# Patient Record
Sex: Female | Born: 1965 | Race: White | Hispanic: No | Marital: Married | Smoking: Never smoker
Health system: Southern US, Community
[De-identification: ages and names within clinical notes are randomized; demographics above are authoritative.]

## PROBLEM LIST (undated history)

## (undated) DIAGNOSIS — C50919 Malignant neoplasm of unspecified site of unspecified female breast: Secondary | ICD-10-CM

## (undated) DIAGNOSIS — IMO0002 Reserved for concepts with insufficient information to code with codable children: Secondary | ICD-10-CM

## (undated) DIAGNOSIS — M810 Age-related osteoporosis without current pathological fracture: Secondary | ICD-10-CM

## (undated) DIAGNOSIS — O021 Missed abortion: Secondary | ICD-10-CM

## (undated) DIAGNOSIS — Z1371 Encounter for nonprocreative screening for genetic disease carrier status: Secondary | ICD-10-CM

## (undated) HISTORY — PX: OTHER SURGICAL HISTORY: SHX169

## (undated) HISTORY — DX: Malignant neoplasm of unspecified site of unspecified female breast: C50.919

## (undated) HISTORY — DX: Reserved for concepts with insufficient information to code with codable children: IMO0002

## (undated) HISTORY — DX: Missed abortion: O02.1

## (undated) HISTORY — DX: Encounter for nonprocreative screening for genetic disease carrier status: Z13.71

## (undated) HISTORY — DX: Age-related osteoporosis without current pathological fracture: M81.0

## (undated) HISTORY — PX: DILATION AND CURETTAGE OF UTERUS: SHX78

## (undated) HISTORY — PX: VAGINA SURGERY: SHX829

---

## 1997-09-24 ENCOUNTER — Encounter: Admission: RE | Admit: 1997-09-24 | Discharge: 1997-12-23 | Payer: Self-pay | Admitting: Gynecology

## 1997-11-30 ENCOUNTER — Inpatient Hospital Stay (HOSPITAL_COMMUNITY): Admission: AD | Admit: 1997-11-30 | Discharge: 1997-12-02 | Payer: Self-pay | Admitting: Obstetrics and Gynecology

## 2001-05-18 ENCOUNTER — Other Ambulatory Visit: Admission: RE | Admit: 2001-05-18 | Discharge: 2001-05-18 | Payer: Self-pay | Admitting: Gynecology

## 2001-12-13 ENCOUNTER — Inpatient Hospital Stay (HOSPITAL_COMMUNITY): Admission: AD | Admit: 2001-12-13 | Discharge: 2001-12-15 | Payer: Self-pay | Admitting: Gynecology

## 2001-12-13 ENCOUNTER — Encounter (INDEPENDENT_AMBULATORY_CARE_PROVIDER_SITE_OTHER): Payer: Self-pay

## 2002-01-24 ENCOUNTER — Other Ambulatory Visit: Admission: RE | Admit: 2002-01-24 | Discharge: 2002-01-24 | Payer: Self-pay | Admitting: Gynecology

## 2004-04-14 ENCOUNTER — Other Ambulatory Visit: Admission: RE | Admit: 2004-04-14 | Discharge: 2004-04-14 | Payer: Self-pay | Admitting: Gynecology

## 2005-05-10 ENCOUNTER — Other Ambulatory Visit: Admission: RE | Admit: 2005-05-10 | Discharge: 2005-05-10 | Payer: Self-pay | Admitting: Gynecology

## 2006-05-12 ENCOUNTER — Other Ambulatory Visit: Admission: RE | Admit: 2006-05-12 | Discharge: 2006-05-12 | Payer: Self-pay | Admitting: Gynecology

## 2007-05-21 ENCOUNTER — Other Ambulatory Visit: Admission: RE | Admit: 2007-05-21 | Discharge: 2007-05-21 | Payer: Self-pay | Admitting: Gynecology

## 2008-01-11 DIAGNOSIS — C50919 Malignant neoplasm of unspecified site of unspecified female breast: Secondary | ICD-10-CM

## 2008-01-11 HISTORY — DX: Malignant neoplasm of unspecified site of unspecified female breast: C50.919

## 2008-05-26 ENCOUNTER — Ambulatory Visit: Payer: Self-pay | Admitting: Gynecology

## 2008-05-26 ENCOUNTER — Encounter: Payer: Self-pay | Admitting: Gynecology

## 2008-05-26 ENCOUNTER — Other Ambulatory Visit: Admission: RE | Admit: 2008-05-26 | Discharge: 2008-05-26 | Payer: Self-pay | Admitting: Gynecology

## 2008-07-23 ENCOUNTER — Encounter: Admission: RE | Admit: 2008-07-23 | Discharge: 2008-07-23 | Payer: Self-pay | Admitting: Radiology

## 2008-07-24 ENCOUNTER — Ambulatory Visit: Payer: Self-pay | Admitting: Genetic Counselor

## 2008-09-02 ENCOUNTER — Ambulatory Visit (HOSPITAL_COMMUNITY): Admission: RE | Admit: 2008-09-02 | Discharge: 2008-09-02 | Payer: Self-pay | Admitting: General Surgery

## 2008-09-02 ENCOUNTER — Encounter (INDEPENDENT_AMBULATORY_CARE_PROVIDER_SITE_OTHER): Payer: Self-pay | Admitting: General Surgery

## 2008-09-02 HISTORY — PX: BREAST LUMPECTOMY: SHX2

## 2008-09-24 ENCOUNTER — Ambulatory Visit: Payer: Self-pay | Admitting: Oncology

## 2008-09-29 LAB — CBC WITH DIFFERENTIAL/PLATELET
BASO%: 0.7 % (ref 0.0–2.0)
Basophils Absolute: 0 10*3/uL (ref 0.0–0.1)
EOS%: 2.2 % (ref 0.0–7.0)
HGB: 13.2 g/dL (ref 11.6–15.9)
MCH: 28.4 pg (ref 25.1–34.0)
MCHC: 33.6 g/dL (ref 31.5–36.0)
MONO#: 0.4 10*3/uL (ref 0.1–0.9)
RDW: 14.2 % (ref 11.2–14.5)
WBC: 5.4 10*3/uL (ref 3.9–10.3)
lymph#: 1.2 10*3/uL (ref 0.9–3.3)

## 2008-09-30 ENCOUNTER — Ambulatory Visit: Admission: RE | Admit: 2008-09-30 | Discharge: 2008-10-09 | Payer: Self-pay | Admitting: Radiation Oncology

## 2008-09-30 LAB — COMPREHENSIVE METABOLIC PANEL
ALT: <8 U/L (ref 0–35)
AST: 16 U/L (ref 0–37)
Albumin: 4.5 g/dL (ref 3.5–5.2)
CO2: 23 mEq/L (ref 19–32)
Calcium: 8.9 mg/dL (ref 8.4–10.5)
Chloride: 106 mEq/L (ref 96–112)
Potassium: 3.7 mEq/L (ref 3.5–5.3)
Total Protein: 7.6 g/dL (ref 6.0–8.3)

## 2008-10-03 ENCOUNTER — Ambulatory Visit (HOSPITAL_COMMUNITY): Admission: RE | Admit: 2008-10-03 | Discharge: 2008-10-03 | Payer: Self-pay | Admitting: Oncology

## 2008-10-09 ENCOUNTER — Ambulatory Visit (HOSPITAL_COMMUNITY): Admission: RE | Admit: 2008-10-09 | Discharge: 2008-10-09 | Payer: Self-pay | Admitting: Oncology

## 2008-10-17 ENCOUNTER — Ambulatory Visit: Payer: Self-pay | Admitting: Oncology

## 2008-10-24 LAB — CBC WITH DIFFERENTIAL/PLATELET
BASO%: 0.5 % (ref 0.0–2.0)
EOS%: 0.4 % (ref 0.0–7.0)
HCT: 37.1 % (ref 34.8–46.6)
LYMPH%: 9.5 % — ABNORMAL LOW (ref 14.0–49.7)
MCH: 29 pg (ref 25.1–34.0)
MCHC: 33.6 g/dL (ref 31.5–36.0)
MONO#: 1.1 10*3/uL — ABNORMAL HIGH (ref 0.1–0.9)
MONO%: 8 % (ref 0.0–14.0)
NEUT%: 81.6 % — ABNORMAL HIGH (ref 38.4–76.8)
Platelets: 269 10*3/uL (ref 145–400)
RBC: 4.28 10*6/uL (ref 3.70–5.45)
WBC: 14.1 10*3/uL — ABNORMAL HIGH (ref 3.9–10.3)

## 2008-10-24 LAB — BASIC METABOLIC PANEL
Calcium: 8.9 mg/dL (ref 8.4–10.5)
Creatinine, Ser: 0.96 mg/dL (ref 0.40–1.20)
Sodium: 139 mEq/L (ref 135–145)

## 2008-11-07 LAB — COMPREHENSIVE METABOLIC PANEL
AST: 16 U/L (ref 0–37)
Albumin: 4.7 g/dL (ref 3.5–5.2)
BUN: 17 mg/dL (ref 6–23)
CO2: 23 mEq/L (ref 19–32)
Calcium: 9.5 mg/dL (ref 8.4–10.5)
Chloride: 103 mEq/L (ref 96–112)
Creatinine, Ser: 0.88 mg/dL (ref 0.40–1.20)
Potassium: 4.5 mEq/L (ref 3.5–5.3)

## 2008-11-07 LAB — CBC WITH DIFFERENTIAL/PLATELET
Basophils Absolute: 0 10*3/uL (ref 0.0–0.1)
EOS%: 0 % (ref 0.0–7.0)
Eosinophils Absolute: 0 10*3/uL (ref 0.0–0.5)
HCT: 38.7 % (ref 34.8–46.6)
HGB: 12.8 g/dL (ref 11.6–15.9)
MCH: 28.4 pg (ref 25.1–34.0)
MONO#: 0.6 10*3/uL (ref 0.1–0.9)
NEUT#: 12.2 10*3/uL — ABNORMAL HIGH (ref 1.5–6.5)
NEUT%: 91.2 % — ABNORMAL HIGH (ref 38.4–76.8)
RDW: 14.7 % — ABNORMAL HIGH (ref 11.2–14.5)
WBC: 13.4 10*3/uL — ABNORMAL HIGH (ref 3.9–10.3)
lymph#: 0.6 10*3/uL — ABNORMAL LOW (ref 0.9–3.3)

## 2008-11-13 LAB — CBC WITH DIFFERENTIAL/PLATELET
Basophils Absolute: 0.1 10*3/uL (ref 0.0–0.1)
Eosinophils Absolute: 0.1 10*3/uL (ref 0.0–0.5)
HGB: 12.3 g/dL (ref 11.6–15.9)
MONO#: 0.5 10*3/uL (ref 0.1–0.9)
NEUT#: 2.9 10*3/uL (ref 1.5–6.5)
Platelets: 237 10*3/uL (ref 145–400)
RBC: 4.21 10*6/uL (ref 3.70–5.45)
RDW: 15.8 % — ABNORMAL HIGH (ref 11.2–14.5)
WBC: 4.4 10*3/uL (ref 3.9–10.3)

## 2008-11-26 ENCOUNTER — Ambulatory Visit: Payer: Self-pay | Admitting: Oncology

## 2008-11-28 LAB — CBC WITH DIFFERENTIAL/PLATELET
Eosinophils Absolute: 0 10*3/uL (ref 0.0–0.5)
MONO#: 0.8 10*3/uL (ref 0.1–0.9)
MONO%: 5.5 % (ref 0.0–14.0)
NEUT#: 12.3 10*3/uL — ABNORMAL HIGH (ref 1.5–6.5)
RBC: 4.44 10*6/uL (ref 3.70–5.45)
RDW: 14.9 % — ABNORMAL HIGH (ref 11.2–14.5)
WBC: 13.9 10*3/uL — ABNORMAL HIGH (ref 3.9–10.3)
lymph#: 0.8 10*3/uL — ABNORMAL LOW (ref 0.9–3.3)
nRBC: 0 % (ref 0–0)

## 2008-11-28 LAB — COMPREHENSIVE METABOLIC PANEL
Albumin: 4.5 g/dL (ref 3.5–5.2)
BUN: 18 mg/dL (ref 6–23)
CO2: 24 mEq/L (ref 19–32)
Calcium: 9.5 mg/dL (ref 8.4–10.5)
Glucose, Bld: 127 mg/dL — ABNORMAL HIGH (ref 70–99)
Potassium: 4.1 mEq/L (ref 3.5–5.3)
Sodium: 139 mEq/L (ref 135–145)
Total Protein: 7.2 g/dL (ref 6.0–8.3)

## 2008-12-09 LAB — CBC WITH DIFFERENTIAL/PLATELET
EOS%: 0.2 % (ref 0.0–7.0)
HCT: 38.4 % (ref 34.8–46.6)
HGB: 12.6 g/dL (ref 11.6–15.9)
MCHC: 32.8 g/dL (ref 31.5–36.0)
MONO#: 1.6 10*3/uL — ABNORMAL HIGH (ref 0.1–0.9)
MONO%: 10 % (ref 0.0–14.0)
NEUT#: 13.1 10*3/uL — ABNORMAL HIGH (ref 1.5–6.5)
NEUT%: 81 % — ABNORMAL HIGH (ref 38.4–76.8)
WBC: 16.2 10*3/uL — ABNORMAL HIGH (ref 3.9–10.3)
lymph#: 1.3 10*3/uL (ref 0.9–3.3)

## 2008-12-18 LAB — COMPREHENSIVE METABOLIC PANEL
Albumin: 4.3 g/dL (ref 3.5–5.2)
Alkaline Phosphatase: 70 U/L (ref 39–117)
BUN: 18 mg/dL (ref 6–23)
CO2: 27 mEq/L (ref 19–32)
Calcium: 9.5 mg/dL (ref 8.4–10.5)
Chloride: 103 mEq/L (ref 96–112)
Glucose, Bld: 139 mg/dL — ABNORMAL HIGH (ref 70–99)
Potassium: 4 mEq/L (ref 3.5–5.3)
Sodium: 136 mEq/L (ref 135–145)
Total Protein: 7.1 g/dL (ref 6.0–8.3)

## 2008-12-18 LAB — CBC WITH DIFFERENTIAL/PLATELET
Basophils Absolute: 0 10*3/uL (ref 0.0–0.1)
EOS%: 0 % (ref 0.0–7.0)
Eosinophils Absolute: 0 10*3/uL (ref 0.0–0.5)
HCT: 36.2 % (ref 34.8–46.6)
HGB: 12.1 g/dL (ref 11.6–15.9)
MCH: 29.2 pg (ref 25.1–34.0)
MONO#: 0.3 10*3/uL (ref 0.1–0.9)
NEUT#: 14.9 10*3/uL — ABNORMAL HIGH (ref 1.5–6.5)
NEUT%: 93.8 % — ABNORMAL HIGH (ref 38.4–76.8)
RDW: 14.8 % — ABNORMAL HIGH (ref 11.2–14.5)
lymph#: 0.7 10*3/uL — ABNORMAL LOW (ref 0.9–3.3)

## 2008-12-23 ENCOUNTER — Ambulatory Visit: Admission: RE | Admit: 2008-12-23 | Discharge: 2009-01-09 | Payer: Self-pay | Admitting: Radiation Oncology

## 2008-12-24 LAB — CBC WITH DIFFERENTIAL/PLATELET
Basophils Absolute: 0.1 10*3/uL (ref 0.0–0.1)
EOS%: 2 % (ref 0.0–7.0)
Eosinophils Absolute: 0.1 10*3/uL (ref 0.0–0.5)
HGB: 11.8 g/dL (ref 11.6–15.9)
MCV: 87.8 fL (ref 79.5–101.0)
MONO%: 7 % (ref 0.0–14.0)
NEUT#: 3.5 10*3/uL (ref 1.5–6.5)
RBC: 4.02 10*6/uL (ref 3.70–5.45)
RDW: 14.3 % (ref 11.2–14.5)
WBC: 4.4 10*3/uL (ref 3.9–10.3)
lymph#: 0.4 10*3/uL — ABNORMAL LOW (ref 0.9–3.3)

## 2009-01-11 ENCOUNTER — Ambulatory Visit: Admission: RE | Admit: 2009-01-11 | Discharge: 2009-03-04 | Payer: Self-pay | Admitting: Radiation Oncology

## 2009-01-27 ENCOUNTER — Other Ambulatory Visit: Payer: Self-pay | Admitting: Oncology

## 2009-01-27 ENCOUNTER — Ambulatory Visit: Payer: Self-pay | Admitting: Oncology

## 2009-01-27 LAB — CBC WITH DIFFERENTIAL/PLATELET
BASO%: 1.3 % (ref 0.0–2.0)
HCT: 37.9 % (ref 34.8–46.6)
HGB: 12.8 g/dL (ref 11.6–15.9)
MCHC: 33.8 g/dL (ref 31.5–36.0)
MONO#: 0.7 10*3/uL (ref 0.1–0.9)
NEUT%: 64.9 % (ref 38.4–76.8)
WBC: 5.5 10*3/uL (ref 3.9–10.3)
lymph#: 0.6 10*3/uL — ABNORMAL LOW (ref 0.9–3.3)

## 2009-01-27 LAB — COMPREHENSIVE METABOLIC PANEL
ALT: 11 U/L (ref 0–35)
CO2: 28 mEq/L (ref 19–32)
Calcium: 9.4 mg/dL (ref 8.4–10.5)
Chloride: 102 mEq/L (ref 96–112)
Creatinine, Ser: 0.74 mg/dL (ref 0.40–1.20)
Sodium: 140 mEq/L (ref 135–145)
Total Protein: 6.6 g/dL (ref 6.0–8.3)

## 2009-01-27 LAB — CANCER ANTIGEN 27.29: CA 27.29: 40 U/mL — ABNORMAL HIGH (ref 0–39)

## 2009-03-25 ENCOUNTER — Ambulatory Visit: Payer: Self-pay | Admitting: Oncology

## 2009-03-27 LAB — CBC WITH DIFFERENTIAL/PLATELET
BASO%: 0.2 % (ref 0.0–2.0)
Basophils Absolute: 0 10*3/uL (ref 0.0–0.1)
HCT: 40.2 % (ref 34.8–46.6)
HGB: 13.4 g/dL (ref 11.6–15.9)
LYMPH%: 16.1 % (ref 14.0–49.7)
MCH: 28.9 pg (ref 25.1–34.0)
MCHC: 33.3 g/dL (ref 31.5–36.0)
MONO#: 0.4 10*3/uL (ref 0.1–0.9)
NEUT%: 68.6 % (ref 38.4–76.8)
Platelets: 192 10*3/uL (ref 145–400)
WBC: 3.9 10*3/uL (ref 3.9–10.3)

## 2009-03-27 LAB — COMPREHENSIVE METABOLIC PANEL
ALT: 11 U/L (ref 0–35)
BUN: 15 mg/dL (ref 6–23)
CO2: 27 mEq/L (ref 19–32)
Calcium: 8.7 mg/dL (ref 8.4–10.5)
Creatinine, Ser: 0.88 mg/dL (ref 0.40–1.20)
Total Bilirubin: 0.6 mg/dL (ref 0.3–1.2)

## 2009-03-27 LAB — LACTATE DEHYDROGENASE: LDH: 142 U/L (ref 94–250)

## 2009-03-27 LAB — VITAMIN D 25 HYDROXY (VIT D DEFICIENCY, FRACTURES): Vit D, 25-Hydroxy: 28 ng/mL — ABNORMAL LOW (ref 30–89)

## 2009-03-27 LAB — CANCER ANTIGEN 27.29: CA 27.29: 26 U/mL (ref 0–39)

## 2009-05-27 ENCOUNTER — Other Ambulatory Visit: Admission: RE | Admit: 2009-05-27 | Discharge: 2009-05-27 | Payer: Self-pay | Admitting: Gynecology

## 2009-05-27 ENCOUNTER — Ambulatory Visit: Payer: Self-pay | Admitting: Gynecology

## 2009-09-17 ENCOUNTER — Ambulatory Visit: Payer: Self-pay | Admitting: Oncology

## 2009-09-22 LAB — CBC WITH DIFFERENTIAL/PLATELET
BASO%: 0.8 % (ref 0.0–2.0)
EOS%: 1.8 % (ref 0.0–7.0)
MCH: 29.8 pg (ref 25.1–34.0)
MCHC: 34 g/dL (ref 31.5–36.0)
RBC: 4.21 10*6/uL (ref 3.70–5.45)
RDW: 12.1 % (ref 11.2–14.5)
lymph#: 1.2 10*3/uL (ref 0.9–3.3)

## 2009-09-23 LAB — COMPREHENSIVE METABOLIC PANEL
ALT: 9 U/L (ref 0–35)
AST: 15 U/L (ref 0–37)
Albumin: 4 g/dL (ref 3.5–5.2)
Calcium: 9.1 mg/dL (ref 8.4–10.5)
Chloride: 105 mEq/L (ref 96–112)
Potassium: 3.7 mEq/L (ref 3.5–5.3)

## 2010-03-23 ENCOUNTER — Other Ambulatory Visit: Payer: Self-pay | Admitting: Oncology

## 2010-03-23 ENCOUNTER — Encounter (HOSPITAL_BASED_OUTPATIENT_CLINIC_OR_DEPARTMENT_OTHER): Payer: Self-pay | Admitting: Oncology

## 2010-03-23 DIAGNOSIS — C50919 Malignant neoplasm of unspecified site of unspecified female breast: Secondary | ICD-10-CM

## 2010-03-23 DIAGNOSIS — Z170 Estrogen receptor positive status [ER+]: Secondary | ICD-10-CM

## 2010-03-23 LAB — CBC WITH DIFFERENTIAL/PLATELET
BASO%: 1.1 % (ref 0.0–2.0)
Eosinophils Absolute: 0.1 10*3/uL (ref 0.0–0.5)
MCHC: 34 g/dL (ref 31.5–36.0)
MONO#: 0.3 10*3/uL (ref 0.1–0.9)
NEUT#: 3.1 10*3/uL (ref 1.5–6.5)
RBC: 4.12 10*6/uL (ref 3.70–5.45)
RDW: 12.6 % (ref 11.2–14.5)
WBC: 4.8 10*3/uL (ref 3.9–10.3)
lymph#: 1.1 10*3/uL (ref 0.9–3.3)

## 2010-03-23 LAB — COMPREHENSIVE METABOLIC PANEL
ALT: 13 U/L (ref 0–35)
Albumin: 4.3 g/dL (ref 3.5–5.2)
CO2: 25 mEq/L (ref 19–32)
Glucose, Bld: 111 mg/dL — ABNORMAL HIGH (ref 70–99)
Potassium: 3.7 mEq/L (ref 3.5–5.3)
Sodium: 140 mEq/L (ref 135–145)
Total Bilirubin: 0.5 mg/dL (ref 0.3–1.2)
Total Protein: 6.8 g/dL (ref 6.0–8.3)

## 2010-03-23 LAB — CANCER ANTIGEN 27.29: CA 27.29: 24 U/mL (ref 0–39)

## 2010-03-23 LAB — VITAMIN D 25 HYDROXY (VIT D DEFICIENCY, FRACTURES): Vit D, 25-Hydroxy: 32 ng/mL (ref 30–89)

## 2010-03-30 ENCOUNTER — Encounter (HOSPITAL_BASED_OUTPATIENT_CLINIC_OR_DEPARTMENT_OTHER): Payer: BC Managed Care – PPO | Admitting: Oncology

## 2010-03-30 DIAGNOSIS — C50919 Malignant neoplasm of unspecified site of unspecified female breast: Secondary | ICD-10-CM

## 2010-03-30 DIAGNOSIS — Z170 Estrogen receptor positive status [ER+]: Secondary | ICD-10-CM

## 2010-03-31 ENCOUNTER — Other Ambulatory Visit: Payer: Self-pay | Admitting: Oncology

## 2010-03-31 DIAGNOSIS — C50919 Malignant neoplasm of unspecified site of unspecified female breast: Secondary | ICD-10-CM

## 2010-04-17 LAB — DIFFERENTIAL
Lymphs Abs: 1.7 10*3/uL (ref 0.7–4.0)
Monocytes Absolute: 0.7 10*3/uL (ref 0.1–1.0)
Monocytes Relative: 10 % (ref 3–12)
Neutro Abs: 4.5 10*3/uL (ref 1.7–7.7)
Neutrophils Relative %: 61 % (ref 43–77)

## 2010-04-17 LAB — CBC
HCT: 42 % (ref 36.0–46.0)
Hemoglobin: 14.1 g/dL (ref 12.0–15.0)
MCHC: 33.5 g/dL (ref 30.0–36.0)
Platelets: 325 10*3/uL (ref 150–400)
RDW: 12.1 % (ref 11.5–15.5)

## 2010-04-17 LAB — COMPREHENSIVE METABOLIC PANEL
Albumin: 4 g/dL (ref 3.5–5.2)
BUN: 10 mg/dL (ref 6–23)
Calcium: 9.3 mg/dL (ref 8.4–10.5)
Glucose, Bld: 88 mg/dL (ref 70–99)
Potassium: 4.4 mEq/L (ref 3.5–5.1)
Sodium: 138 mEq/L (ref 135–145)
Total Protein: 7.4 g/dL (ref 6.0–8.3)

## 2010-05-25 NOTE — Op Note (Signed)
Nancy Pollard, Nancy Pollard                  ACCOUNT NO.:  [id number]   MEDICAL RECORD NO.:  [id number]          PATIENT TYPE:  AMB   LOCATION:  SDS                          FACILITY:  [facility]   PHYSICIAN:  Ollen Gross. [name], M.D. DATE OF BIRTH:  18-Aug-1965   DATE OF PROCEDURE:  09/02/2008  DATE OF DISCHARGE:  09/02/2008                               OPERATIVE REPORT   PREOPERATIVE DIAGNOSIS:  Right breast cancer.   POSTOPERATIVE DIAGNOSIS:  Right breast cancer.   PROCEDURE:  Right breast needle-localized lumpectomy with sentinel node  biopsy x4 and injection of blue dye.   SURGEON:  Ollen Gross. [name], M.D.   ANESTHESIA:  General endotracheal.   PROCEDURE:  After informed consent was obtained, the patient was brought  to the operating room, placed in the supine position on the operating  table.  After adequate induction of general anesthesia, the patient's  right chest, breast, and axilla were all prepped with Betadine and  draped in usual sterile manner.  Earlier in the day, the patient had  undergone injection of 1 mCi of technetium sulfur colloid in the  subareolar position.  The patient also underwent wire localization  procedure and the wire was entering the breast laterally, just below the  level of the nipple and headed medially subareolar.  At this point, 2 mL  of methylene blue and 3 mL of injectable saline were also injected in  the subareolar position.  The breast was massaged for several minutes.  NeoProbe was used to identify a hot spot in the right axilla.  A small  transverse incision was made with a 15-blade knife overlying this hot  spot.  This incision was carried down through the skin and subcutaneous  tissue sharply with electrocautery until the axilla was entered.  Blunt  dissection was then carried out in the axilla until a hot blue lymph  node was identified.  It was excised by combination of sharp Bovie  dissection and the lymphatics were clamped with hemostats,  divided, and  ligated with 3-0 Vicryl ties.  This was sent as sentinel node #1.  There  were 2 other lymph nodes after this that were identified that were  neither hot nor blue but were right in the vicinity of the sentinel node  and readily visible and these were also excised sharply with the  electrocautery and sent as sentinel node #2 and 3, even though they were  not hot or blue.  A final sentinel node was identified that was hot and  blue.  Ex vivo counts on the first sentinel node were around 8000, the  fourth sentinel node were around 1000 and the other 2 again were neither  hot nor blue.  Touch preps on all these nodes were negative.  No other  blue dye radioactivity or palpable nodes were identified in the right  axilla.  At this point, the deep layer of the axilla was closed with  interrupted 3-0 Vicryl stitches and the skin was closed with a running 4-  0 Monocryl subcuticular stitch.   Attention was then  turned to the right breast.  A radial incision was  made to incorporate the wire entry site.  This was done with a 15-blade  knife.  This was carried down through the skin and subcutaneous tissue  sharply with electrocautery.  Once into the breast tissue, the path of  the wire could be palpated.  A circular portion of breast tissue was  excised sharply around the wire.  At one point, on the medial portion of  the specimen, we did come down on the wire.  We then backed up and went  farther out around it and recreated this cleft with a stitch for the  pathologist.  We took the dissection all the way down to the chest wall  and took the pectoralis fascia with the specimen.  This was then  oriented according to the colors on the paint kit and sent for specimen  radiograph, which showed the clip and mass to be in the center of the  specimen, was then sent to pathology for further evaluation.  Hemostasis  was achieved using the Bovie electrocautery.  Both wounds were then   infiltrated with 0.25% Marcaine.  The deep layer of the breast wound was  then closed with interrupted 3-0 Vicryl stitches and the skin was closed  with a running 4-0  Monocryl subcuticular stitch.  Dermabond dressings were then applied.  The patient tolerated procedure well.  At the end of the case, all  needle, sponge, and instrument counts were correct.  The patient was  then awakened and taken to recovery in stable condition.      Ollen Gross. [name], M.D.  Electronically Signed     PST/MEDQ  D:  09/02/2008  T:  09/03/2008  Job:  [id number]

## 2010-05-28 NOTE — Discharge Summary (Signed)
   NAME:  Nancy Pollard, Nancy Pollard                            ACCOUNT NO.:  [id number]   MEDICAL RECORD NO.:  [id number]                   PATIENT TYPE:  INP   LOCATION:  9138                                 FACILITY:  [facility]   PHYSICIAN:  Ivor Costa. Farrel Gobble, M.D.              DATE OF BIRTH:  04-21-1965   DATE OF ADMISSION:  12/13/2001  DATE OF DISCHARGE:  12/15/2001                                 DISCHARGE SUMMARY   DISCHARGE DIAGNOSES:  1. Intrauterine pregnancy 41 weeks, delivery.  2. Status post spontaneous vaginal delivery.  3. 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  4. Positive group B strep.   HISTORY OF PRESENT ILLNESS:  This is a 45 year old female gravida 3, para 1  with EDC of December 06, 2001.  The patient's prenatal care had been  complicated by history of prior group B Strep.  She also has a history of  first pregnancy missed AB and trisomy 51.  This pregnancy normal  amniocentesis.   HOSPITAL COURSE:  On December 13, 2001, the patient presented to the office  complaining of contractions, increased pelvic pain, was found to be 7 to 8  cm dilated, completely effaced and -1 to 0 station in the office.  Therefore, she was sent to [facility] for delivery.   On December 13, 2001, the patient was admitted in labor, begun on Pen-G IV  for group B prophylaxis.  Artificial rupture of membranes performed and  subsequently on December 13, 2001, at 1555 the patient underwent a  spontaneous vaginal delivery of a female, Apgars of 9 and 9, weight 8 pounds  10 ounces.  There was a midline episiotomy secondary to third degree  extension.  On the right labia majora there were two raised pigmented  lesions which were excised and submitted to pathology which revealed a  pathology of varix associated with thrombosis.  There were no complications.  Postpartum, the patient left the delivery room in stable condition.  She was  discharged to home on December 15, 2001, and given the usual precautionary  postpartum instructions.   LABORATORY AND ACCESSORY DATA:  Labs checked for blood loss anemia revealed  a hemoglobin on December 14, 2001, was 11.8.   DISPOSITION:  The patient was discharged home.  Follow-up in six weeks and  call if any problems prior to time in the office.     Susa Loffler, P.A.                    Ivor Costa. Farrel Gobble, M.D.    TSG/MEDQ  D:  01/11/2002  T:  01/11/2002  Job:  [id number]

## 2010-05-28 NOTE — H&P (Signed)
Nancy Pollard, Nancy Pollard                              ACCOUNT NO.:  [id number]   MEDICAL RECORD NO.:  [id number]                   PATIENT TYPE:   LOCATION:                                       FACILITY:   PHYSICIAN:  Juan H. Lily Peer, M.D.             DATE OF BIRTH:   DATE OF ADMISSION:  12/13/2001  DATE OF DISCHARGE:                                HISTORY & PHYSICAL   CHIEF COMPLAINT:  1. Labor.  2. Post dates.  3. Advanced cervical dilatation.   HISTORY:  The patient is a 45 year old gravida 3, para 1, AB1 with a last  menstrual period March 01, 2001, estimated date of confinement December 06, 2001 currently [redacted] weeks gestation.  The patient had been seen in the  office yesterday for routine OB visit and was 1 cm, 50% effaced, and -3  station and was having nonfrequent contractions.  She was scheduled for  induction next week.  During the evening her contractions increased and she  presented to the office this morning where she was having infrequent  contractions, but having a lot of pelvic discomfort.  On examination she was  found to be 7-8 cm dilated, complete effacement, -1 to 0 station.  The  patient with previous pregnancy significant for the fact that was delivery  was by vacuum and also had positive group B Strep culture and also her first  pregnancy had a missed AB and was trisomy 16.  She did have a genetic  amniocentesis this pregnancy with 46XY.   PAST MEDICAL HISTORY:  She had a spontaneous AB in 74 (trisomy 16).  November 1999 had a normal spontaneous vaginal delivery with vacuum assisted  and forceps of an 8 pound 10 ounce female at [redacted] weeks gestation.  She has  had history of D&C in 1997.  She had a vaginal __________ in 1988.   ALLERGIES:  She denies any allergies.   REVIEW OF SYSTEMS:  See [employer] form.   PHYSICAL EXAMINATION:  VITAL SIGNS:  Blood pressure today was 102/60, weight  178 pounds.  Urine was negative for sugar, trace protein.  HEENT:   Unremarkable.  NECK:  Supple.  Trachea midline.  No carotid bruits.  No thyromegaly.  LUNGS:  Clear to auscultation without rhonchi or wheezes.  HEART:  Regular rate and rhythm.  No murmurs or gallops.  BREASTS:  Not done.  ABDOMEN:  Gravid uterus, 37 cm fundal height.  Vertex presentation by  [facility] maneuver.  PELVIC:  Cervix 7-8 cm dilated, complete effacement, 0/-1 station.  Intact  membranes.   PRENATAL LABORATORIES:  O+ blood type.  Negative antibody screen.  VDRL was  nonreactive.  Rubella immune.  Hepatitis B surface antigen/HIV were  negative.  Pap smear was normal.  Genetic amniocentesis 46XY.  Normal  amniotic fluid, alpha fetoprotein.  Diabetes screen was normal.  The patient  with past history of  positive GBS culture.   ASSESSMENT:  A 45 year old gravida 3, para 1, AB1 at [redacted] weeks gestation in  labor with advanced cervical dilatation.  Cervix 7-8 cm dilated with  complete effacement 0/-1 station, intact membranes, reassuring fetal heart  rate tracing.  Past history of positive GBS.  The patient will be sent to  [facility] and be started on pen-G 5 million units intravenous  followed by 2.5 million units q.4h. and will proceed then with artificial  rupture of membranes.  Anticipate rapid delivery.   PLAN:  As per assessment above.                                               Juan H. Lily Peer, M.D.    JHF/MEDQ  D:  12/13/2001  T:  12/13/2001  Job:  [id number]

## 2010-05-31 ENCOUNTER — Encounter: Payer: BC Managed Care – PPO | Admitting: Gynecology

## 2010-06-01 ENCOUNTER — Other Ambulatory Visit: Payer: Self-pay | Admitting: Gynecology

## 2010-06-01 ENCOUNTER — Other Ambulatory Visit (HOSPITAL_COMMUNITY)
Admission: RE | Admit: 2010-06-01 | Discharge: 2010-06-01 | Disposition: A | Discharge status: Home / Self Care | Payer: 59 | Source: Ambulatory Visit | Attending: Gynecology | Admitting: Gynecology

## 2010-06-01 ENCOUNTER — Encounter (INDEPENDENT_AMBULATORY_CARE_PROVIDER_SITE_OTHER): Payer: 59 | Admitting: Gynecology

## 2010-06-01 DIAGNOSIS — Z01419 Encounter for gynecological examination (general) (routine) without abnormal findings: Secondary | ICD-10-CM

## 2010-06-01 DIAGNOSIS — Z124 Encounter for screening for malignant neoplasm of cervix: Secondary | ICD-10-CM | POA: Insufficient documentation

## 2010-06-01 DIAGNOSIS — R635 Abnormal weight gain: Secondary | ICD-10-CM

## 2010-06-01 DIAGNOSIS — N83339 Acquired atrophy of ovary and fallopian tube, unspecified side: Secondary | ICD-10-CM

## 2010-06-08 ENCOUNTER — Encounter (INDEPENDENT_AMBULATORY_CARE_PROVIDER_SITE_OTHER): Payer: 59

## 2010-06-08 DIAGNOSIS — M899 Disorder of bone, unspecified: Secondary | ICD-10-CM

## 2010-06-15 ENCOUNTER — Encounter (INDEPENDENT_AMBULATORY_CARE_PROVIDER_SITE_OTHER): Payer: Self-pay | Admitting: General Surgery

## 2010-06-15 ENCOUNTER — Ambulatory Visit (HOSPITAL_COMMUNITY)
Admission: RE | Admit: 2010-06-15 | Discharge: 2010-06-15 | Disposition: A | Discharge status: Home / Self Care | Payer: 59 | Source: Ambulatory Visit | Attending: Oncology | Admitting: Oncology

## 2010-06-15 DIAGNOSIS — C50919 Malignant neoplasm of unspecified site of unspecified female breast: Secondary | ICD-10-CM

## 2010-06-15 DIAGNOSIS — Z853 Personal history of malignant neoplasm of breast: Secondary | ICD-10-CM | POA: Insufficient documentation

## 2010-06-15 MED ORDER — GADOBENATE DIMEGLUMINE 529 MG/ML IV SOLN
15.0000 mL | Freq: Once | INTRAVENOUS | Status: AC | PRN
  Administered 2010-06-15: 15 mL via INTRAVENOUS

## 2010-07-09 ENCOUNTER — Ambulatory Visit (HOSPITAL_COMMUNITY): Payer: 59

## 2010-07-13 ENCOUNTER — Other Ambulatory Visit (INDEPENDENT_AMBULATORY_CARE_PROVIDER_SITE_OTHER): Payer: 59

## 2010-07-13 DIAGNOSIS — M899 Disorder of bone, unspecified: Secondary | ICD-10-CM

## 2010-07-13 DIAGNOSIS — M949 Disorder of cartilage, unspecified: Secondary | ICD-10-CM

## 2010-07-21 ENCOUNTER — Institutional Professional Consult (permissible substitution) (INDEPENDENT_AMBULATORY_CARE_PROVIDER_SITE_OTHER): Payer: 59 | Admitting: Gynecology

## 2010-07-21 DIAGNOSIS — C50919 Malignant neoplasm of unspecified site of unspecified female breast: Secondary | ICD-10-CM

## 2010-07-21 DIAGNOSIS — N951 Menopausal and female climacteric states: Secondary | ICD-10-CM

## 2010-07-21 DIAGNOSIS — M899 Disorder of bone, unspecified: Secondary | ICD-10-CM

## 2010-07-21 DIAGNOSIS — M949 Disorder of cartilage, unspecified: Secondary | ICD-10-CM

## 2010-07-27 ENCOUNTER — Other Ambulatory Visit (INDEPENDENT_AMBULATORY_CARE_PROVIDER_SITE_OTHER): Payer: 59

## 2010-07-27 DIAGNOSIS — Z01818 Encounter for other preprocedural examination: Secondary | ICD-10-CM

## 2010-07-28 IMAGING — CT CT PELVIS W/ CM
2 of 5 series · 16 of 46 positions shown, 18 images · IV contrast (agent unspecified)
Comparison: Breast MR of 07/23/2008 and plain film chest of
08/28/2008.

CT CHEST

CLINICAL DATA: Breast cancer.  New diagnosis.  Right-sided
lumpectomy.

CT CHEST, ABDOMEN AND PELVIS WITH CONTRAST
TECHNIQUE: Contiguous axial images of the chest abdomen and pelvis
were obtained after IV contrast administration.
Contrast: 100  ml [id number]-QDD

[Series 2: cap with st · axial · 0.74mm/px · z∈[-624,-34]mm · 13 of 132 slices shown, 15 images]
[im 7/132  soft-tissue]
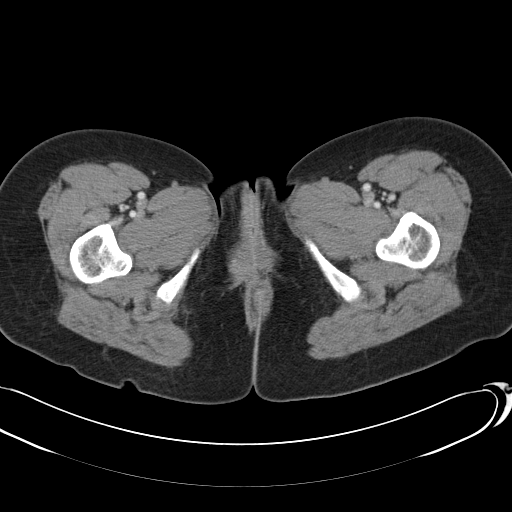
[im 7/132  bone]
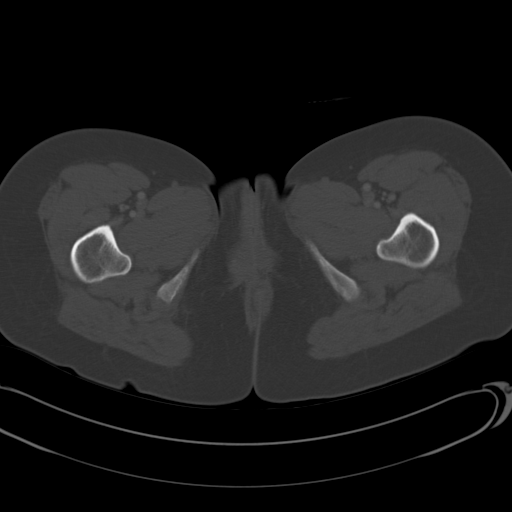
[im 21/132  soft-tissue]
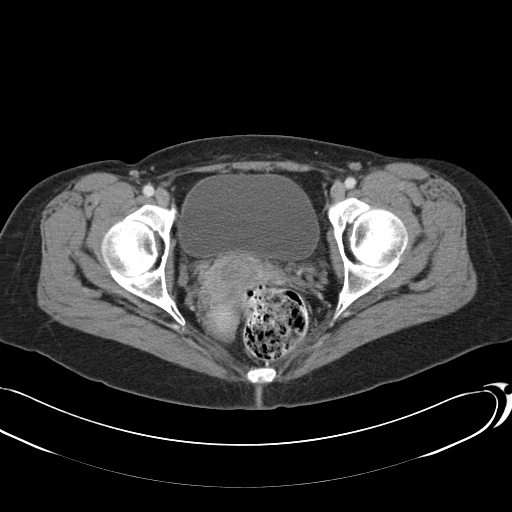
[im 28/132  soft-tissue]
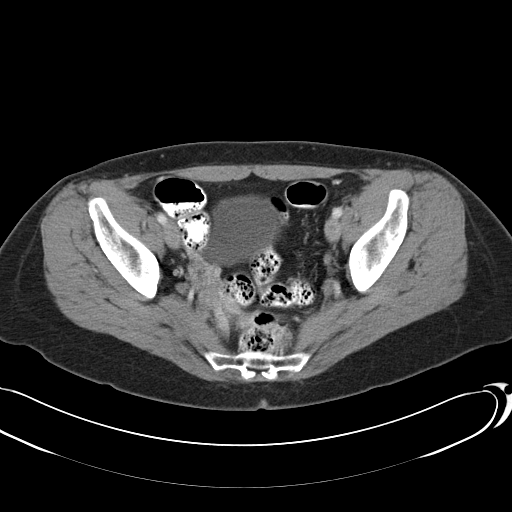
[im 35/132  soft-tissue]
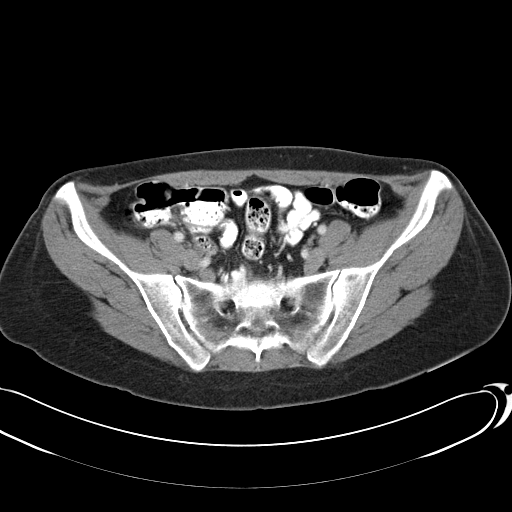
[im 49/132  soft-tissue]
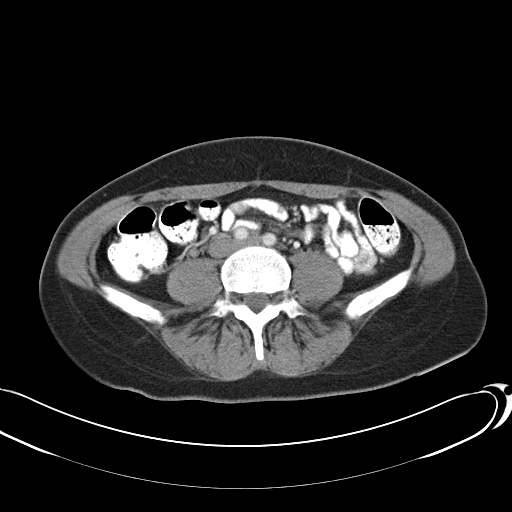
[im 56/132  soft-tissue]
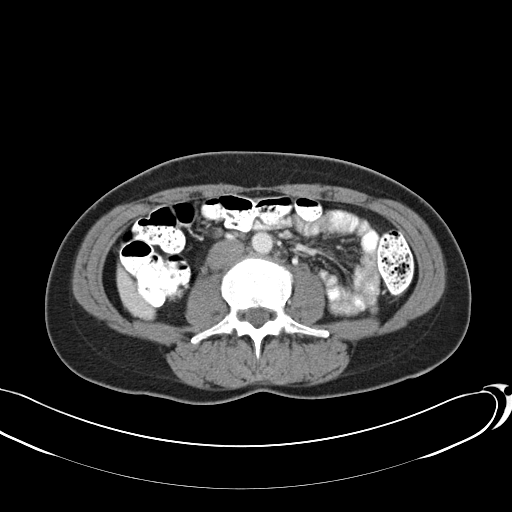
[im 69/132  soft-tissue]
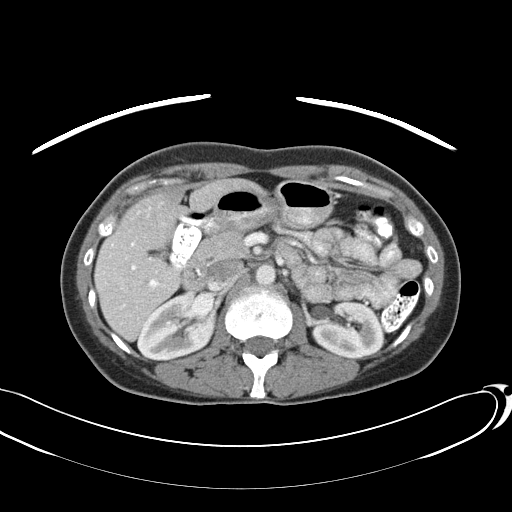
[im 76/132  soft-tissue]
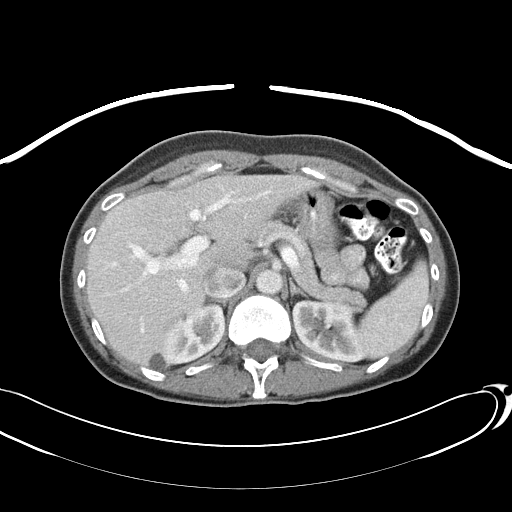
[im 83/132  soft-tissue]
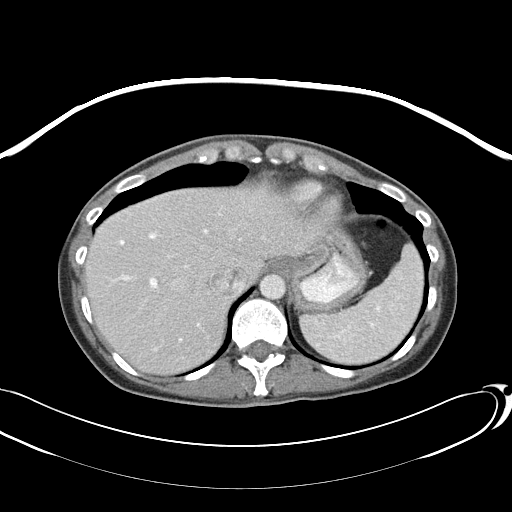
[im 83/132  bone]
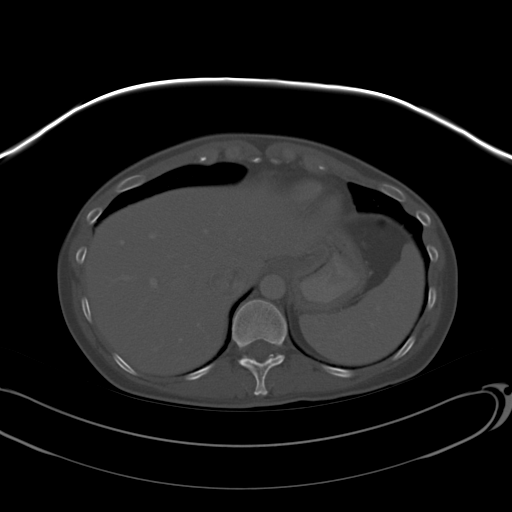
[im 97/132  soft-tissue]
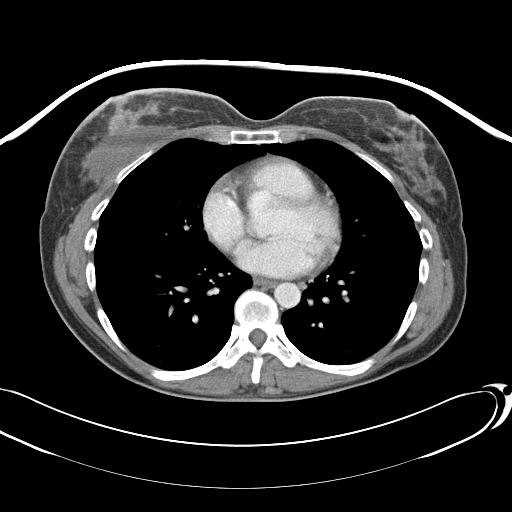
[im 104/132  soft-tissue]
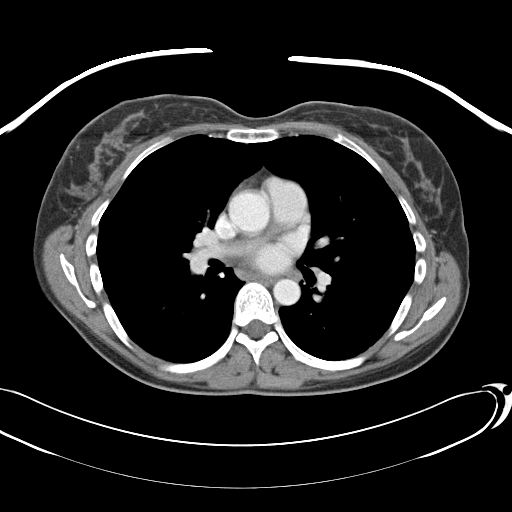
[im 111/132  soft-tissue]
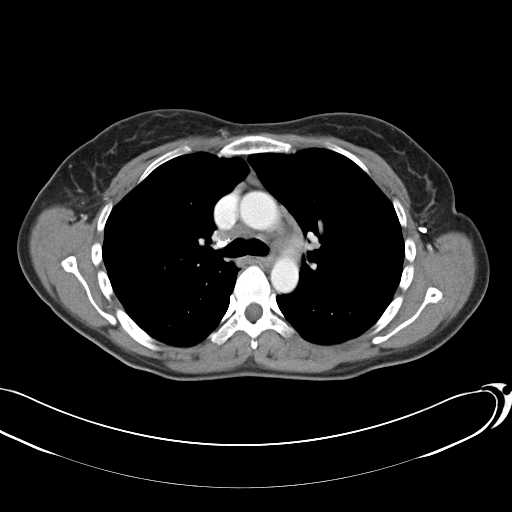
[im 125/132  soft-tissue]
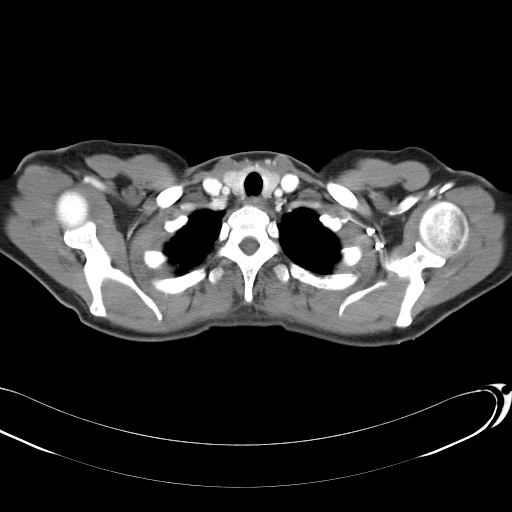

[Series 602: coronal mpr · coronal · 1.29mm/px · 3 of 97 slices shown]
[im 33/97  soft-tissue]
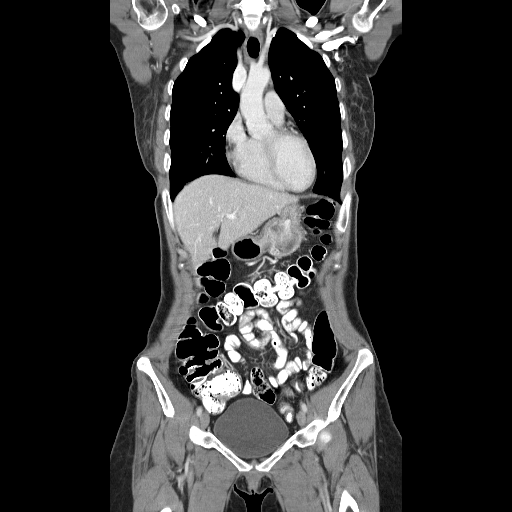
[im 43/97  soft-tissue]
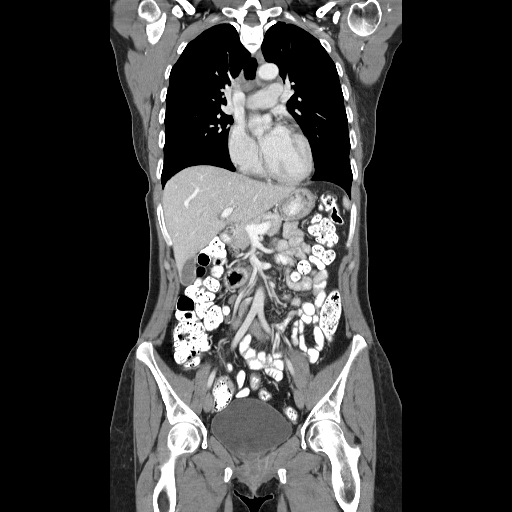
[im 54/97  soft-tissue]
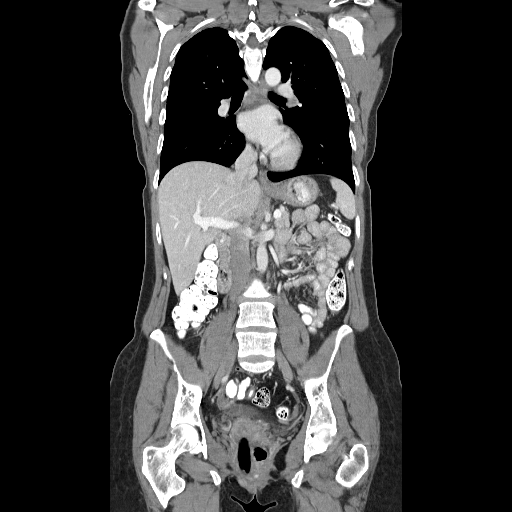

[16 of 46 positions shown; findings below may reference images not displayed]

FINDINGS: Lung windows demonstrate no nodules or airspace
opacities.

Soft tissue windows demonstrate right axillary nodal dissection
without adenopathy.  Fluid density within the right breast measures
2.3 x 6.5 cm including on image 37 and in is likely postoperative.

Normal heart size without pericardial or pleural effusion.  Small
middle mediastinal lymph nodes without adenopathy.  Right hilar
lymph node measures 7 mm.  No internal mammary adenopathy.  Soft
tissue density in the anterior mediastinum measures 1.8 x 0.8 cm on
image 21.
IMPRESSION: 1.  Status post right-sided axillary nodal dissection and
lumpectomy.
2.  Fluid collection in the right breast is likely a postoperative
hematoma or seroma.
3.  Soft tissue density in the anterior mediastinum is most likely
residual thymus.  A single enlarged prevascular lymph node is felt
less likely.  This could be either be reevaluated on short-term
follow-up or more entirely evaluated with PET.

CT ABDOMEN
FINDINGS: Well-circumscribed hypoattenuating posterior right
hepatic lobe lesion is most consistent with a cyst on image 57.
Focal fat adjacent the falciform ligament.  Normal spleen, stomach,
pancreas, gallbladder, biliary tract, adrenal glands, kidneys.

Small retroperitoneal lymph nodes. No retroperitoneal or
retrocrural adenopathy. Normal colon and terminal ileum.  Normal
abdominal small bowel without ascites. Small ileocolic mesenteric
lymph nodes.
IMPRESSION: 1. No acute process or evidence of metastatic disease in the
abdomen.
2.  Right hepatic lobe cyst.

CT PELVIS
FINDINGS: Normal pelvic bowel loops.  No pelvic adenopathy.
Normal urinary bladder.  Tampon in place.  Retroverted uterus.  No
adnexal mass or significant free fluid. No acute osseous
abnormality.
IMPRESSION: No acute process or evidence of metastatic disease in the pelvis.

## 2010-07-29 ENCOUNTER — Other Ambulatory Visit: Payer: Self-pay | Admitting: Gynecology

## 2010-07-29 ENCOUNTER — Ambulatory Visit (HOSPITAL_BASED_OUTPATIENT_CLINIC_OR_DEPARTMENT_OTHER)
Admission: RE | Admit: 2010-07-29 | Discharge: 2010-07-29 | Disposition: A | Discharge status: Home / Self Care | Payer: 59 | Source: Ambulatory Visit | Attending: Gynecology | Admitting: Gynecology

## 2010-07-29 ENCOUNTER — Encounter: Payer: Self-pay | Admitting: Gynecology

## 2010-07-29 DIAGNOSIS — Z801 Family history of malignant neoplasm of trachea, bronchus and lung: Secondary | ICD-10-CM | POA: Insufficient documentation

## 2010-07-29 DIAGNOSIS — Z79899 Other long term (current) drug therapy: Secondary | ICD-10-CM | POA: Insufficient documentation

## 2010-07-29 DIAGNOSIS — Z4009 Encounter for prophylactic removal of other organ: Secondary | ICD-10-CM | POA: Insufficient documentation

## 2010-07-29 DIAGNOSIS — Z800 Family history of malignant neoplasm of digestive organs: Secondary | ICD-10-CM | POA: Insufficient documentation

## 2010-07-29 DIAGNOSIS — M899 Disorder of bone, unspecified: Secondary | ICD-10-CM | POA: Insufficient documentation

## 2010-07-29 DIAGNOSIS — C50919 Malignant neoplasm of unspecified site of unspecified female breast: Secondary | ICD-10-CM | POA: Insufficient documentation

## 2010-07-29 DIAGNOSIS — Z170 Estrogen receptor positive status [ER+]: Secondary | ICD-10-CM | POA: Insufficient documentation

## 2010-07-29 DIAGNOSIS — Z853 Personal history of malignant neoplasm of breast: Secondary | ICD-10-CM

## 2010-07-29 DIAGNOSIS — Z4002 Encounter for prophylactic removal of ovary: Secondary | ICD-10-CM | POA: Insufficient documentation

## 2010-07-29 HISTORY — PX: OTHER SURGICAL HISTORY: SHX169

## 2010-08-02 NOTE — Op Note (Signed)
Nancy Pollard, Nancy Pollard                  ACCOUNT NO.:  [id number]  MEDICAL RECORD NO.:  [id number]  LOCATION:                                 FACILITY:  PHYSICIAN:  Juan H. Lily Peer, M.D.DATE OF BIRTH:  1965-05-09  DATE OF PROCEDURE:  07/29/2010 DATE OF DISCHARGE:                              OPERATIVE REPORT   SURGEON:  Juan H. Lily Peer, MD  FIRST ASSISTANT:  Rande Brunt. Gottsegen, MD  INDICATION FOR OPERATION:  A 45 year old gravida 3, para 2, AB 1 with history of right breast cancer, node negative but estrogen and progesterone receptors were positive, status post lumpectomy and sentinel node dissection status post chemotherapy and radiation, all completed February 2011.  The patient scheduled to undergo prophylactic bilateral salpingo-oophorectomy.  Of note, the patient's BRCA1 and BRCA2 testing for mutation was negative.  PREOPERATIVE DIAGNOSES: 1. Breast cancer. 2. Positive estrogen and progesterone receptors.  PROCEDURE PERFORMED:  Laparoscopic bilateral salpingo-oophorectomy with pelvic washings.  FINDINGS:  Normal-appearing tubes, ovaries, uterus as well as anterior and posterior cul-de-sac.  Normal-appearing appendix, gallbladder, and liver surfaces.  DESCRIPTION OF OPERATION:  After the patient was adequately counseled, she was taken to the operating room where she underwent a successful general endotracheal anesthesia.  The patient had received 1 g of cefotetan for prophylaxis and had PSA stockings for DVT prophylaxis as well.  She was in the low lithotomy position.  The abdomen, vagina, and perineum were prepped and draped in the usual sterile fashion.  A Foley catheter was inserted to monitor urinary output.  Bimanual examination demonstrated a slightly retroverted uterus.  The single-tooth tenaculum was placed on the anterior cervical lip to manipulate the uterus under laparoscopic portion of the procedure.  After the drapes were in place, a small semilunar  incision was made in the infraumbilical region and a 10/11 mm trocar was inserted into the peritoneal cavity. Pneumoperitoneum was then established with approximately 3 L of carbon dioxide.  Systematic pelvic inspection laparoscopically demonstrated no abnormal findings.  Two additional 5 mm ports were made in the patient's right and left lower abdomen under laparoscopic guidance.  Attention was then placed to the proximal right utero-ovarian ligament, which was clamped and coapted and transected with Harmonic scalpel, as was the rest of the mesosalpinx and finally the right infundibulopelvic ligament coapted and transected.  The similar procedure was carried out on the contralateral side.  Following this, the 10/11 mm trocar was removed.  A 5 mm laparoscope was inserted to the left lower abdominal port and an Endopouch was inserted into the peritoneal cavity to retrieve both ovaries, which were identified properly and submitted for histological evaluation.  Of note, before the bilateral salpingo-oophorectomy, pelvic washings had been obtained as well.  The 10/11 mm trocar was then reinserted.  A systematic inspection of the pelvic cavity demonstrated adequate hemostasis and the pelvis was copiously irrigated with normal saline solution.  Pictures were obtained pre and post bilateral salpingo- oophorectomy.  The pneumoperitoneum was released and the instruments were removed.  The subumbilical fascia did not require reapproximation, it was taut and very small but the subcutaneous tissue was reapproximated with a running stitch of 3-0 Vicryl  suture and all 3 port sites skin was reapproximated with Dermabond glue.  For postoperative analgesia, 0.25% Marcaine was infiltrated subcutaneously for a total 10 cc.  The single-tooth tenaculum was removed.  The Foley was removed. The patient was extubated, transferred to recovery room with stable vital signs.  EBL was minimal.  Fluid resuscitation  consisted of 1200 cc of lactated Ringer's.  The patient received 30 mg of Toradol IV en route to the recovery room.     Juan H. Lily Peer, M.D.     JHF/MEDQ  D:  07/29/2010  T:  07/29/2010  Job:  [id number]  Electronically Signed by Reynaldo Minium M.D. on 08/02/2010 09:04:11 AM

## 2010-08-02 NOTE — H&P (Signed)
Nancy Pollard, ACHEY                  ACCOUNT NO.:  [id number]  MEDICAL RECORD NO.:  [id number]  LOCATION:                                 FACILITY:  PHYSICIAN:  Juan H. Lily Peer, M.D.DATE OF BIRTH:  [birth year]  DATE OF ADMISSION:  07/29/2010 DATE OF DISCHARGE:                             HISTORY & PHYSICAL   The patient is scheduled for surgery, Thursday on July 29, 2010, at 7:30 a.m. at [facility].  Please have history and physical available.  CHIEF COMPLAINT:  History of breast cancer, scheduled for prophylactic laparoscopic bilateral salpingo-oophorectomy.  HISTORY:  The patient is a 45 year old gravida 3, para 2, AB 1 who has a history of right breast cancer, node negative, estrogen receptor and progesterone receptive positive, status post lumpectomy and sentinel node dissection and status post chemotherapy and radiation, all completed by February 2011, has been under the care of the medical oncologist Dr. Pierce Crane.  We had discussed about prophylactic laparoscopic removal of both ovaries because of her increased risk of additional breast cancer and ovarian cancer due to the fact that she has receptive positive tumors.  She is currently on tamoxifen and we went through a detailed discussion of risks, benefits, and pros and cons of laparoscopic surgery to remove both ovaries.  She is having some vasomotor symptoms but not interested on being placed on an SSRI.  We had also discussed the BRCA1 and BRCA2 mutation, which were not present when they were analyzed for her recently, although she is fully aware that this does not cover all mutations but nevertheless was encouraging news and now she is scheduled for her laparoscopic surgery.  PAST MEDICAL HISTORY:  She denies any allergies.  OBSTETRICAL AND GYNECOLOGICAL HISTORY:  BRCA1, BRCA2 negative.  Two normal spontaneous vaginal deliveries, 1 missed AB.  OTHER MEDICAL CONDITIONS: 1. Right breast  cancer. 2. Positive estrogen and progesterone receptors. 3. History of osteopenia.  MEDICATIONS:  Consisted of calcium with vitamin D, tamoxifen, and multivitamin.  PAST SURGICAL HISTORY:  Consisted of right lumpectomy and D and C.  FAMILY HISTORY:  Cardiovascular disease, maternal and paternal grandparents.  Thyroid disease in her mother.  Hypertension in maternal grandfather and paternal grandfather.  Lung cancer in paternal uncle, and pancreatic cancer in maternal uncle.  SOCIAL HISTORY:  The patient is in administrative capacity.  REVIEW OF SYSTEMS:  Unremarkable with exception of the GYN entity with her past history of breast cancer.  PHYSICAL EXAMINATION:  VITAL SIGNS:  The patient weighs 151 pounds, 5 feet 8 inches tall, BMI 24.  Blood pressure 118/74. HEENT:  Unremarkable. NECK:  Supple.  Trachea midline.  No carotid bruits, no thyromegaly. LUNGS:  Clear to auscultation without rhonchi or wheezes. HEART:  Regular rate and rhythm.  No murmurs or gallops. BREASTS:  Right breast scar from previous lumpectomy.  No palpable masses.  No supraclavicular or axillary lymphadenopathy. ABDOMEN:  Soft, nontender.  No rebound or guarding. PELVIC:  Bartholin, urethra, Skene glands within normal limits.  Vagina and cervix, no gross lesions.  On inspection, uterus anteverted and normal in size, shape, and consistency. RECTAL:  Deferred.  ASSESSMENT:  A 45 year old gravida  3, para 2, AB 1, with history of right breast cancer with positive estrogen and progesterone receptors, history of negative BRCA1 and BRCA2 analysis, scheduled for laparoscopic bilateral prophylactic salpingo-oophorectomy.  The risks, benefits, and pros and cons of the operation were discussed with the patient including infection.  She will receive prophylaxis antibiotic.  The risk for deep venous thrombosis and subsequent pulmonary embolism; also in the event of any technical difficulty or injury to internal organs  such as bladder, intestine, or blood vessel, and an open laparotomy technique may need to be utilized in an effort to complete the operation, it may require to stay in the hospital additional days.  Also, we discussed potential risk of hemorrhage, that she would need blood or blood products; there is 1 in 100,000 risk of anaphylactic reaction, hepatitis, and AIDS.  All these issues were discussed with the patient in detail.  She is fully aware and all questions were answered.  Of note, her husband has had a vasectomy.  All questions were answered, and will follow accordingly.  PLAN:  The patient is scheduled for laparoscopic bilateral salpingo- oophorectomy on Thursday July 29, 2010, at 7:30 a.m. at [facility].  Please have history and physical available.     Juan H. Lily Peer, M.D.     JHF/MEDQ  D:  07/28/2010  T:  07/28/2010  Job:  [id number]  Electronically Signed by Reynaldo Minium M.D. on 08/02/2010 09:04:09 AM

## 2010-08-03 ENCOUNTER — Telehealth: Payer: Self-pay | Admitting: *Deleted

## 2010-08-03 NOTE — Telephone Encounter (Signed)
PT INFORMED OF B-9 PATHOLOGY REPORT.

## 2010-08-09 ENCOUNTER — Ambulatory Visit (INDEPENDENT_AMBULATORY_CARE_PROVIDER_SITE_OTHER): Payer: Self-pay | Admitting: General Surgery

## 2010-08-09 ENCOUNTER — Other Ambulatory Visit: Payer: 59

## 2010-08-12 ENCOUNTER — Ambulatory Visit (INDEPENDENT_AMBULATORY_CARE_PROVIDER_SITE_OTHER): Payer: 59 | Admitting: Gynecology

## 2010-08-12 ENCOUNTER — Encounter: Payer: Self-pay | Admitting: Gynecology

## 2010-08-12 VITALS — BP 120/74

## 2010-08-12 DIAGNOSIS — Z87898 Personal history of other specified conditions: Secondary | ICD-10-CM

## 2010-08-12 DIAGNOSIS — Z789 Other specified health status: Secondary | ICD-10-CM

## 2010-08-12 NOTE — Progress Notes (Signed)
Ms. Talsma presented to the office today for postop visit she status post prophylactic bilateral salpingo-oophorectomy. Patient with history of right breast cancer positive estrogen and progesterone receptor currently on tamoxifen. With prior history of the right breast lumpectomy.  Exam: Abdomen was soft nontender no rebound or guarding port sites are intact  Pathology report discussed with the patient today benign findings patient will continue to follow with the medical oncologist as well as anterior Monday so presently in and her mammogram is up to date posterior next year for her annual exam or when necessary

## 2010-08-12 NOTE — Patient Instructions (Signed)
Follow up for annual exam next May. Your pathology report was benign. Continue to follow up with your medical oncologist.

## 2010-08-17 ENCOUNTER — Ambulatory Visit (INDEPENDENT_AMBULATORY_CARE_PROVIDER_SITE_OTHER): Payer: 59 | Admitting: General Surgery

## 2010-08-17 ENCOUNTER — Encounter (INDEPENDENT_AMBULATORY_CARE_PROVIDER_SITE_OTHER): Payer: Self-pay | Admitting: General Surgery

## 2010-08-17 DIAGNOSIS — C50919 Malignant neoplasm of unspecified site of unspecified female breast: Secondary | ICD-10-CM

## 2010-08-17 NOTE — Patient Instructions (Signed)
Continue tamoxifen Continue regular self exams F/U in 7 mo

## 2010-08-17 NOTE — Progress Notes (Signed)
Subjective:     Patient ID: Nancy Pollard, female   DOB: 1965-11-01, 45 y.o.   MRN: [id number]  HPI The patient is a 89 her a white female who is now 2 years out from a right breast lumpectomy and negative sentinel node biopsy for a T1 C. 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 Since her last visit she underwent oophorectomy just a couple weeks ago. She's feeling well. She continues to take tamoxifen without any problems.  Review of Systems     Objective:   Physical Exam On exam Lungs: Layer bilaterally with no use of accessory respiratory muscles Heart: Regular rate and rhythm with an impulse in the left chest. Abdomen: Soft and nontender without palpable mass or hepatosplenomegaly. Breast: Her right breast and axillary Incisions have healed nicely. She has no palpable mass in either breast. No axillary supraclavicular or cervical lymphadenopathy.    Assessment:     2 years postop from a right lumpectomy and negative sentinel node biopsy    Plan:     Her mammogram and MRI in June showed no evidence of malignancy. She will be seeing Dr. Donnie Coffin next month. She'll continue her tamoxifen for now. We'll plan to see her back in 7 months. She agrees to call if she has any problems in the meantime.

## 2010-09-16 ENCOUNTER — Other Ambulatory Visit: Payer: Self-pay | Admitting: Oncology

## 2010-09-16 ENCOUNTER — Encounter (HOSPITAL_BASED_OUTPATIENT_CLINIC_OR_DEPARTMENT_OTHER): Payer: 59 | Admitting: Oncology

## 2010-09-16 DIAGNOSIS — Z170 Estrogen receptor positive status [ER+]: Secondary | ICD-10-CM

## 2010-09-16 DIAGNOSIS — C50919 Malignant neoplasm of unspecified site of unspecified female breast: Secondary | ICD-10-CM

## 2010-09-16 LAB — CBC WITH DIFFERENTIAL/PLATELET
BASO%: 1.4 % (ref 0.0–2.0)
EOS%: 2.2 % (ref 0.0–7.0)
HCT: 36.4 % (ref 34.8–46.6)
LYMPH%: 21.8 % (ref 14.0–49.7)
MCH: 29.9 pg (ref 25.1–34.0)
MCHC: 34.3 g/dL (ref 31.5–36.0)
MCV: 87.3 fL (ref 79.5–101.0)
MONO#: 0.5 10*3/uL (ref 0.1–0.9)
MONO%: 8.2 % (ref 0.0–14.0)
NEUT%: 66.4 % (ref 38.4–76.8)
Platelets: 254 10*3/uL (ref 145–400)

## 2010-09-17 LAB — COMPREHENSIVE METABOLIC PANEL
ALT: 17 U/L (ref 0–35)
CO2: 22 mEq/L (ref 19–32)
Calcium: 9.1 mg/dL (ref 8.4–10.5)
Chloride: 105 mEq/L (ref 96–112)
Creatinine, Ser: 0.93 mg/dL (ref 0.50–1.10)
Glucose, Bld: 132 mg/dL — ABNORMAL HIGH (ref 70–99)
Sodium: 140 mEq/L (ref 135–145)
Total Bilirubin: 0.5 mg/dL (ref 0.3–1.2)
Total Protein: 6.4 g/dL (ref 6.0–8.3)

## 2010-09-17 LAB — FOLLICLE STIMULATING HORMONE: FSH: 65.5 m[IU]/mL

## 2010-09-17 LAB — CANCER ANTIGEN 27.29: CA 27.29: 26 U/mL (ref 0–39)

## 2010-09-27 LAB — ESTRADIOL, ULTRA SENS: Estradiol, Ultra Sensitive: 7 pg/mL

## 2010-10-04 ENCOUNTER — Encounter (HOSPITAL_BASED_OUTPATIENT_CLINIC_OR_DEPARTMENT_OTHER): Payer: 59 | Admitting: Oncology

## 2010-10-04 DIAGNOSIS — Z170 Estrogen receptor positive status [ER+]: Secondary | ICD-10-CM

## 2010-10-04 DIAGNOSIS — C50919 Malignant neoplasm of unspecified site of unspecified female breast: Secondary | ICD-10-CM

## 2010-10-04 DIAGNOSIS — Z923 Personal history of irradiation: Secondary | ICD-10-CM

## 2010-12-07 NOTE — Progress Notes (Signed)
CC:   Billie Lade, Ph.D., M.D. Juan H. Lily Peer, M.D.  PROBLEM:  ER/PR positive breast cancer, status post lumpectomy, sentinel lymph node dissection, status post 4 cycles of every-3-week Taxotere and Cytoxan, status post radiation therapy completed 03/01/2009, on adjuvant hormonal therapy.  Most recent MRI scan of both breasts was done in June 2012.  No specific evidence of malignancy was seen.  REVIEW OF SYSTEMS:  Otherwise negative.  PHYSICAL EXAMINATION:  A pleasant, alert woman, looking her stated age. Vital Signs:  Blood pressure is 102/68, temperature 98.7, pulse 90, weight 154.2.  No palpable adenopathy in the head and neck area. Oropharynx normal.  Lungs are clear.  Heart sounds are normal.  Right and left breasts are free of any masses.  The affected side has healed well.  There are some pigmentation changes and mild skin changes over the right breast.  No specific abnormalities were noted.  The left breast was normal.  Both axilla were negative.  No palpable hepatosplenomegaly.  No inguinal adenopathy.  No peripheral clubbing, cyanosis or edema.  LABORATORY STUDIES:  From 09/16/2010:  Estradiol level 7.  FSH elevated at 55.  CBC is normal.  Chemistries normal as well.  IMPRESSION AND PLAN:  Ms Rossetti is doing well.  I will see her in followup in 6 months' time.  We started discussion of a switch to an aromatase inhibitor; she will make that decision when she returns.    ______________________________ Pierce Crane, M.D., F.R.C.P.C. PR/MEDQ  D:  12/05/2010  T:  12/07/2010  Job:  [id number]

## 2010-12-09 NOTE — Progress Notes (Signed)
CC:   Juan H. Lily Peer, M.D. Ollen Gross. [name], M.D. Billie Lade, Ph.D., M.D.  PROBLEM:  Node negative, ER/PR positive breast cancer, status post lumpectomy, sentinel lymph node dissection, status post 4 cycles of every 3 week __________  Cytoxan, radiation therapy completed 03/09/2009 on Tamoxifen.  INTERVAL HISTORY:  Ms. Seese returns for followup overall doing pretty well.  She continues on her Tamoxifen which she is tolerating.  MEDICATIONS:  The medication list was reviewed.  There have been no other changes.  PERFORMANCE STATUS:  ECOG status is zero.  PHYSICAL EXAMINATION:  General Appearance:  A pleasant, alert woman looking stated age.  Vital Signs:  Blood pressure is 102/68. Temperature 98.7.  Pulse 98.  Respiratory rate 20.  Weight is 154.2. Head and Neck Exam:  Unremarkable.  Trachea is midline.  No thyromegaly. Lungs:  Clear.  Heart:  Sounds are normal.  Breast Exam:  Right and left breasts are free of any masses.  Lumpectomy site has healed well.  No nipple retraction or skin changes.  Both axillae are negative.  Abdomen: Soft.  No palpable hepatosplenomegaly.  No inguinal adenopathy. Extremities:  No peripheral edema.  LABORATORY DATA:  Lab studies from 09/16/2010:  These are within normal limits.  The vitamin D level is 48.  Tumor marker is normal.  Estradiol level is 7 with an FSH of 65.5.  IMPRESSION AND PLAN:  Ms. Hendler is doing well.  She has been amenorrheic for a number of years.  Her hormone levels would indicate that this is consistent.  We discussed the possibility of switching to an AI.  We will make those changes.  I will see her in 6 months' time for followup. We will make the appropriate plans for imaging studies.    ______________________________ Pierce Crane, M.D., F.R.C.P.C. PR/MEDQ  D:  12/09/2010  T:  12/09/2010  Job:  [id number]

## 2010-12-14 ENCOUNTER — Telehealth: Payer: Self-pay | Admitting: *Deleted

## 2010-12-14 NOTE — Telephone Encounter (Signed)
patient confirmed over the phone the new date and time on 04-01-2011 at 9:00am for dr.rubin patient confirmed over the phone the new date and time on 03-25-2011 at 8:30am for lab only

## 2011-03-01 ENCOUNTER — Encounter (INDEPENDENT_AMBULATORY_CARE_PROVIDER_SITE_OTHER): Payer: Self-pay | Admitting: General Surgery

## 2011-03-01 ENCOUNTER — Ambulatory Visit (INDEPENDENT_AMBULATORY_CARE_PROVIDER_SITE_OTHER): Payer: 59 | Admitting: General Surgery

## 2011-03-01 VITALS — BP 106/66 | HR 92 | Temp 98.8°F | Resp 16 | Ht 67.0 in | Wt 156.4 lb

## 2011-03-01 DIAGNOSIS — C50919 Malignant neoplasm of unspecified site of unspecified female breast: Secondary | ICD-10-CM

## 2011-03-01 NOTE — Patient Instructions (Signed)
Continue regular self exams  

## 2011-03-01 NOTE — Progress Notes (Signed)
Subjective:     Patient ID: Nancy Pollard, female   DOB: [birth year], 46 y.o.   MRN: [id number]  HPI The patient is a 46 year old white female who is 2 years out from a right breast lumpectomy and negative sentinel node biopsy for a T1 C. N0 right breast cancer. She only occasionally has some sharp pains that tend to come and go but this doesn't really bother her much. She had a oophorectomy back in the summer time and has since switched from tamoxifen to Arimidex. She's tolerating this well and seems to have fewer hot flashes. She is otherwise been healthy  Review of Systems  Constitutional: Negative.   HENT: Negative.   Eyes: Negative.   Respiratory: Negative.   Cardiovascular: Negative.   Gastrointestinal: Negative.   Genitourinary: Negative.   Musculoskeletal: Negative.   Skin: Negative.   Neurological: Negative.   Hematological: Negative.   Psychiatric/Behavioral: Negative.        Objective:   Physical Exam  Constitutional: She is oriented to person, place, and time. She appears well-developed and well-nourished.  HENT:  Head: Normocephalic and atraumatic.  Eyes: Conjunctivae and EOM are normal. Pupils are equal, round, and reactive to light.  Neck: Normal range of motion. Neck supple.  Cardiovascular: Normal rate, regular rhythm and normal heart sounds.   Pulmonary/Chest: Effort normal and breath sounds normal.       She has no palpable masses in either breast. The right breast is a little bit thicker secondary to her radiation. No axillary supraclavicular or cervical lymphadenopathy  Abdominal: Soft. Bowel sounds are normal.  Musculoskeletal: Normal range of motion.  Neurological: She is alert and oriented to person, place, and time.  Skin: Skin is warm and dry.  Psychiatric: She has a normal mood and affect. Her behavior is normal.       Assessment:     2-1/2 years status post right lumpectomy and negative sentinel node biopsy    Plan:     She will continue to do  regular self exams. She is due for her next mammogram in June. We will plan to see her back in about 6 months.

## 2011-03-25 ENCOUNTER — Other Ambulatory Visit (HOSPITAL_BASED_OUTPATIENT_CLINIC_OR_DEPARTMENT_OTHER): Payer: 59 | Admitting: Lab

## 2011-03-25 DIAGNOSIS — C50919 Malignant neoplasm of unspecified site of unspecified female breast: Secondary | ICD-10-CM

## 2011-03-25 DIAGNOSIS — Z170 Estrogen receptor positive status [ER+]: Secondary | ICD-10-CM

## 2011-03-25 LAB — CBC WITH DIFFERENTIAL/PLATELET
Basophils Absolute: 0 10*3/uL (ref 0.0–0.1)
Eosinophils Absolute: 0.1 10*3/uL (ref 0.0–0.5)
HGB: 13 g/dL (ref 11.6–15.9)
NEUT#: 2.6 10*3/uL (ref 1.5–6.5)
RBC: 4.48 10*6/uL (ref 3.70–5.45)
RDW: 12.9 % (ref 11.2–14.5)
WBC: 4.1 10*3/uL (ref 3.9–10.3)
lymph#: 0.9 10*3/uL (ref 0.9–3.3)

## 2011-03-26 LAB — COMPREHENSIVE METABOLIC PANEL
AST: 21 U/L (ref 0–37)
Albumin: 4.5 g/dL (ref 3.5–5.2)
BUN: 13 mg/dL (ref 6–23)
Calcium: 9.4 mg/dL (ref 8.4–10.5)
Chloride: 104 mEq/L (ref 96–112)
Glucose, Bld: 80 mg/dL (ref 70–99)
Potassium: 3.9 mEq/L (ref 3.5–5.3)
Sodium: 140 mEq/L (ref 135–145)
Total Protein: 7 g/dL (ref 6.0–8.3)

## 2011-04-01 ENCOUNTER — Telehealth: Payer: Self-pay | Admitting: *Deleted

## 2011-04-01 ENCOUNTER — Ambulatory Visit (HOSPITAL_BASED_OUTPATIENT_CLINIC_OR_DEPARTMENT_OTHER): Payer: 59 | Admitting: Oncology

## 2011-04-01 VITALS — BP 100/70 | HR 86 | Temp 98.5°F | Ht 67.0 in | Wt 156.8 lb

## 2011-04-01 DIAGNOSIS — C50919 Malignant neoplasm of unspecified site of unspecified female breast: Secondary | ICD-10-CM

## 2011-04-01 NOTE — Progress Notes (Signed)
Hematology and Oncology Follow Up Visit  Nancy Pollard [id number] 1965/11/13 45 y.o. 04/01/2011 11:07 AM PCP Dr] Livia Snellen Principle Diagnosis: Node negative er/pr+ breast cancer s/p TC x 4 completed with xrt 2/11 on arimidex; s/p oophorectomy 8/12.  Interim History:  There have been no intercurrent illness, hospitalizations or medication changes.  Medications: I have reviewed the patient's current medications.  Allergies: No Known Allergies  Past Medical History, Surgical history, Social history, and Family History were reviewed and updated.  Review of Systems: Constitutional:  Negative for fever, chills, night sweats, anorexia, weight loss, pain. Cardiovascular: negative Respiratory: negative Neurological: negative Dermatological: negative ENT: negative Skin Gastrointestinal: no abdominal pain, change in bowel habits, or black or bloody stools Genito-Urinary: negative Hematological and Lymphatic: negative Breast: negative Musculoskeletal: negative Remaining ROS negative.  Physical Exam: Blood pressure 100/70, pulse 86, temperature 98.5 F (36.9 C), temperature source Oral, height 5\' 7"  (1.702 m), weight 156 lb 12.8 oz (71.124 kg). ECOG:  General appearance: alert, cooperative and appears stated age Head: Normocephalic, without obvious abnormality, atraumatic Neck: no adenopathy, no carotid bruit, no JVD, supple, symmetrical, trachea midline and thyroid not enlarged, symmetric, no tenderness/mass/nodules Lymph nodes: Cervical, supraclavicular, and axillary nodes normal. Cardiac : regular rate and rhythm, no murmurs or gallops Pulmonary:clear to auscultation bilaterally and normal percussion bilaterally Breasts: inspection negative, no nipple discharge or bleeding, no masses or nodularity palpable Abdomen:soft, non-tender; bowel sounds normal; no masses,  no organomegaly Extremities negative Neuro: alert, oriented, normal speech, no focal findings or movement disorder  noted  Lab Results: Lab Results  Component Value Date   WBC 4.1 03/25/2011   HGB 13.0 03/25/2011   HCT 38.9 03/25/2011   MCV 86.8 03/25/2011   PLT 259 03/25/2011     Chemistry      Component Value Date/Time   NA 140 03/25/2011 0839   K 3.9 03/25/2011 0839   CL 104 03/25/2011 0839   CO2 25 03/25/2011 0839   BUN 13 03/25/2011 0839   CREATININE 0.88 03/25/2011 0839      Component Value Date/Time   CALCIUM 9.4 03/25/2011 0839   ALKPHOS 71 03/25/2011 0839   AST 21 03/25/2011 0839   ALT 12 03/25/2011 0839   BILITOT 0.9 03/25/2011 0839      .pathology. Radiological Studies: chest X-ray n/a Mammogram   Bone density 5/13  Impression and Plan: Nancy Pollard is doing well, f/u in 6 months with imaging studies.   More than 50% of the visit was spent in patient-related counselling   Pierce Crane, MD 3/22/201311:07 AM

## 2011-04-01 NOTE — Telephone Encounter (Signed)
gave patient appointment for 09-2011 printed out calendar and gave to the patient 

## 2011-06-02 ENCOUNTER — Other Ambulatory Visit (HOSPITAL_COMMUNITY)
Admission: RE | Admit: 2011-06-02 | Discharge: 2011-06-02 | Disposition: A | Discharge status: Home / Self Care | Payer: 59 | Source: Ambulatory Visit | Attending: Pediatrics | Admitting: Pediatrics

## 2011-06-02 ENCOUNTER — Encounter: Payer: Self-pay | Admitting: Gynecology

## 2011-06-02 ENCOUNTER — Ambulatory Visit (INDEPENDENT_AMBULATORY_CARE_PROVIDER_SITE_OTHER): Payer: 59 | Admitting: Gynecology

## 2011-06-02 VITALS — BP 110/68 | Ht 67.75 in | Wt 153.0 lb

## 2011-06-02 DIAGNOSIS — Z01419 Encounter for gynecological examination (general) (routine) without abnormal findings: Secondary | ICD-10-CM | POA: Insufficient documentation

## 2011-06-02 NOTE — Patient Instructions (Signed)
Remember to cut down on the amount of vitamin D to 2,000 units/day of Vitamin D3 cholecalciferol. Will do bone density next year. Have a great trip!

## 2011-06-02 NOTE — Progress Notes (Signed)
Nancy Pollard 1965/12/07 [id number]   History:    46 y.o.  for annual gyn exam who has a history of right lumpectomy for breast cancer (positive estrogen and progesterone receptors) who had completed treatment with chemotherapy and radiation in the past and originally was started on tamoxifen in February 2011 but was later switched by her oncologist Dr. Pierce Crane and she is currently on Arimidex. Patient has been tested in the past for the BRCA1 and BRCA2 gene mutation and was not present. She had a prophylactic laparoscopic BSO last year with pathology report being benign. She is currently taking her calcium and vitamin D but she was taking too much vitamin D whereby she was taking 10,000 units daily and have instructed her to bring down to 2000 units daily. She does her monthly self breast examination she has a followup with Dr. Caron Presume next few weeks and no blood work will be drawn today and her mammogram was negative in 2012. Her last bone density study was in May 2012 and her lowest T score was in the AP spine with -2.0. She is currently not having any vasomotor symptoms.  Past medical history,surgical history, family history and social history were all reviewed and documented in the EPIC chart.  Gynecologic History Patient's last menstrual period was 11/01/2008. Contraception: none Last Pap: 2012. Results were: normal Last mammogram: 2012. Results were: normal  Obstetric History OB History    Grav Para Term Preterm Abortions TAB SAB Ect Mult Living   3 2 2  1  1   2      # Outc Date GA Lbr Len/2nd Wgt Sex Del Anes PTL Lv   1 TRM     F SVD  No Yes   2 TRM     M SVD  No Yes   3 SAB                ROS: A ROS was performed and pertinent positives and negatives are included in the history.  GENERAL: No fevers or chills. HEENT: No change in vision, no earache, sore throat or sinus congestion. NECK: No pain or stiffness. CARDIOVASCULAR: No chest pain or pressure. No palpitations. PULMONARY: No  shortness of breath, cough or wheeze. GASTROINTESTINAL: No abdominal pain, nausea, vomiting or diarrhea, melena or bright red blood per rectum. GENITOURINARY: No urinary frequency, urgency, hesitancy or dysuria. MUSCULOSKELETAL: No joint or muscle pain, no back pain, no recent trauma. DERMATOLOGIC: No rash, no itching, no lesions. ENDOCRINE: No polyuria, polydipsia, no heat or cold intolerance. No recent change in weight. HEMATOLOGICAL: No anemia or easy bruising or bleeding. NEUROLOGIC: No headache, seizures, numbness, tingling or weakness. PSYCHIATRIC: No depression, no loss of interest in normal activity or change in sleep pattern.     Exam: chaperone present  BP 110/68  Ht 5' 7.75" (1.721 m)  Wt 153 lb (69.4 kg)  BMI 23.44 kg/m2  LMP 11/01/2008  Body mass index is 23.44 kg/(m^2).  General appearance : Well developed well nourished female. No acute distress HEENT: Neck supple, trachea midline, no carotid bruits, no thyroidmegaly Lungs: Clear to auscultation, no rhonchi or wheezes, or rib retractions  Heart: Regular rate and rhythm, no murmurs or gallops Breast:Examined in sitting and supine position were symmetrical in appearance, no palpable masses or tenderness,  no skin retraction, no nipple inversion, no nipple discharge, no skin discoloration, no axillary or supraclavicular lymphadenopathy Abdomen: no palpable masses or tenderness, no rebound or guarding Extremities: no edema or skin discoloration or  tenderness  Pelvic:  Bartholin, Urethra, Skene Glands: Within normal limits             Vagina: No gross lesions or discharge  Cervix: No gross lesions or discharge  Uterus  anteverted, normal size, shape and consistency, non-tender and mobile  Adnexa  Without masses or tenderness  Anus and perineum  normal   Rectovaginal  normal sphincter tone without palpated masses or tenderness             Hemoccult not done     Assessment/Plan:  46 y.o. female for gynecological annual exam  today with no abnormalities detected. She was instructed to continue her monthly self breast examination. We discussed once again the importance of cutting down on the amount of vitamin D that she's taking. Her maximum should be 2000 units daily. She will have her lab drawn in Dr. Wilford Grist office with her upcoming visit. Pap smear was done today. New screening guidelines discussed. She has a followup mammogram in June. We discussed importance of weightbearing exercises 45 minutes 3 or 4 times a week as well. We'll followup with a bone density study here in the office next week.   Ok Edwards MD, 11:33 AM 06/02/2011

## 2011-08-30 ENCOUNTER — Ambulatory Visit (INDEPENDENT_AMBULATORY_CARE_PROVIDER_SITE_OTHER): Payer: 59 | Admitting: General Surgery

## 2011-08-30 ENCOUNTER — Encounter (INDEPENDENT_AMBULATORY_CARE_PROVIDER_SITE_OTHER): Payer: Self-pay | Admitting: General Surgery

## 2011-08-30 ENCOUNTER — Telehealth: Payer: Self-pay | Admitting: *Deleted

## 2011-08-30 VITALS — BP 94/60 | HR 80 | Temp 97.2°F | Ht 68.0 in | Wt 147.8 lb

## 2011-08-30 DIAGNOSIS — C50919 Malignant neoplasm of unspecified site of unspecified female breast: Secondary | ICD-10-CM

## 2011-08-30 NOTE — Telephone Encounter (Signed)
per the md on vac moved patient appointment to 10-10-2011

## 2011-08-30 NOTE — Patient Instructions (Signed)
Continue arimidex Continue regular self exams 

## 2011-09-01 ENCOUNTER — Encounter (INDEPENDENT_AMBULATORY_CARE_PROVIDER_SITE_OTHER): Payer: Self-pay | Admitting: General Surgery

## 2011-09-01 NOTE — Progress Notes (Signed)
Subjective:     Patient ID: Nancy Pollard, female   DOB: 11-15-1965, 46 y.o.   MRN: [id number]  HPI The patient is a 46 year old white female who is 2-1/2 years out from a right lumpectomy and negative sentinel node biopsy for a T1 C. N0 right breast cancer. She is taking Arimidex and tolerating that well. She has no complaints today. She had a recent mammogram done that shows no evidence of malignancy.  Review of Systems  Constitutional: Negative.   HENT: Negative.   Eyes: Negative.   Respiratory: Negative.   Cardiovascular: Negative.   Gastrointestinal: Negative.   Genitourinary: Negative.   Musculoskeletal: Negative.   Skin: Negative.   Neurological: Negative.   Hematological: Negative.   Psychiatric/Behavioral: Negative.        Objective:   Physical Exam  Constitutional: She is oriented to person, place, and time. She appears well-developed and well-nourished.  HENT:  Head: Normocephalic and atraumatic.  Eyes: Conjunctivae and EOM are normal. Pupils are equal, round, and reactive to light.  Neck: Normal range of motion. Neck supple.  Cardiovascular: Normal rate, regular rhythm and normal heart sounds.   Pulmonary/Chest: Effort normal and breath sounds normal.       There is no palpable mass in either breast. No palpable axillary supraclavicular or cervical lymphadenopathy.  Abdominal: Soft. Bowel sounds are normal. She exhibits no mass. There is no tenderness.  Musculoskeletal: Normal range of motion.  Lymphadenopathy:    She has no cervical adenopathy.  Neurological: She is alert and oriented to person, place, and time.  Skin: Skin is warm and dry.  Psychiatric: She has a normal mood and affect. Her behavior is normal.       Assessment:     2 and half years status post right lumpectomy    Plan:     At this point she will continue to take her Arimidex Daily.she will continue to do regular self exams. We will plan to see her back in about 6 months.

## 2011-09-26 ENCOUNTER — Ambulatory Visit (INDEPENDENT_AMBULATORY_CARE_PROVIDER_SITE_OTHER): Payer: 59 | Admitting: General Surgery

## 2011-09-26 ENCOUNTER — Encounter (INDEPENDENT_AMBULATORY_CARE_PROVIDER_SITE_OTHER): Payer: Self-pay | Admitting: General Surgery

## 2011-09-26 VITALS — BP 118/68 | HR 76 | Temp 97.3°F | Resp 16 | Ht 68.0 in | Wt 147.4 lb

## 2011-09-26 DIAGNOSIS — C50919 Malignant neoplasm of unspecified site of unspecified female breast: Secondary | ICD-10-CM

## 2011-09-26 DIAGNOSIS — N644 Mastodynia: Secondary | ICD-10-CM

## 2011-09-26 NOTE — Patient Instructions (Signed)
Continue regular self exams Continue arimidex 

## 2011-09-26 NOTE — Progress Notes (Signed)
Subjective:     Patient ID: Nancy Pollard, female   DOB: 08/29/1965, 46 y.o.   MRN: [id number]  HPI The patient is a 46 year old white female who is 2-1/2 years out from a right lumpectomy and negative sentinel node biopsy for a T1 C. N0 right breast cancer. Her last mammogram was in June that showed no evidence of malignancy. She has been doing very well but all of a sudden this past Thursday she developed some burning sensation in the upper and inner portion of the right breast. She describes a burning sensation as constant. She has had no discharge from her nipple.  Review of Systems  Constitutional: Negative.   HENT: Negative.   Eyes: Negative.   Respiratory: Negative.   Cardiovascular: Negative.   Gastrointestinal: Negative.   Genitourinary: Negative.   Musculoskeletal: Negative.   Skin: Negative.   Neurological: Negative.   Hematological: Negative.   Psychiatric/Behavioral: Negative.        Objective:   Physical Exam  Constitutional: She is oriented to person, place, and time. She appears well-developed and well-nourished.  HENT:  Head: Normocephalic and atraumatic.  Eyes: Conjunctivae normal and EOM are normal. Pupils are equal, round, and reactive to light.  Neck: Normal range of motion. Neck supple.  Cardiovascular: Normal rate, regular rhythm and normal heart sounds.   Pulmonary/Chest: Effort normal and breath sounds normal.       Her right breast incision has healed nicely. There is no palpable mass of either breast. No palpable axillary or supraclavicular cervical lymphadenopathy.  Abdominal: Soft. Bowel sounds are normal. She exhibits no mass. There is no tenderness.  Musculoskeletal: Normal range of motion.  Lymphadenopathy:    She has no cervical adenopathy.  Neurological: She is alert and oriented to person, place, and time.  Skin: Skin is warm and dry.  Psychiatric: She has a normal mood and affect. Her behavior is normal.       Assessment:     2-1/2 years  status post right lumpectomy with a new breast pain on the right   Plan:     At this point clinically I do not feel any changes in the right breast. Since it's only been a couple months since her last mammogram we will see if the breast center we'll do an ultrasound of the area. Otherwise we will see her back in 3 months for a short interval followup. She will continue to do regular self exams and take arimidex daily

## 2011-09-27 ENCOUNTER — Telehealth: Payer: Self-pay | Admitting: Oncology

## 2011-09-27 NOTE — Telephone Encounter (Signed)
Returned pt's call re sooner appt. Per pt no need for sooner appt she saw Dr. Carolynne Edouard today. Confirmed w/pt next appt for 9/30.

## 2011-09-28 ENCOUNTER — Encounter: Payer: Self-pay | Admitting: Gynecology

## 2011-10-06 ENCOUNTER — Ambulatory Visit: Payer: 59 | Admitting: Oncology

## 2011-10-06 ENCOUNTER — Other Ambulatory Visit: Payer: 59 | Admitting: Lab

## 2011-10-07 ENCOUNTER — Other Ambulatory Visit: Payer: Self-pay | Admitting: *Deleted

## 2011-10-07 DIAGNOSIS — C50919 Malignant neoplasm of unspecified site of unspecified female breast: Secondary | ICD-10-CM

## 2011-10-10 ENCOUNTER — Ambulatory Visit (HOSPITAL_BASED_OUTPATIENT_CLINIC_OR_DEPARTMENT_OTHER): Payer: 59 | Admitting: Oncology

## 2011-10-10 ENCOUNTER — Other Ambulatory Visit (HOSPITAL_BASED_OUTPATIENT_CLINIC_OR_DEPARTMENT_OTHER): Payer: 59 | Admitting: Lab

## 2011-10-10 ENCOUNTER — Telehealth: Payer: Self-pay | Admitting: Oncology

## 2011-10-10 VITALS — BP 95/64 | HR 90 | Temp 98.8°F | Resp 20 | Ht 68.0 in | Wt 146.6 lb

## 2011-10-10 DIAGNOSIS — C50919 Malignant neoplasm of unspecified site of unspecified female breast: Secondary | ICD-10-CM

## 2011-10-10 DIAGNOSIS — Z170 Estrogen receptor positive status [ER+]: Secondary | ICD-10-CM

## 2011-10-10 LAB — COMPREHENSIVE METABOLIC PANEL (CC13)
ALT: 10 U/L (ref 0–55)
AST: 18 U/L (ref 5–34)
Creatinine: 0.9 mg/dL (ref 0.6–1.1)
Sodium: 142 mEq/L (ref 136–145)
Total Bilirubin: 1.2 mg/dL (ref 0.20–1.20)
Total Protein: 7.7 g/dL (ref 6.4–8.3)

## 2011-10-10 LAB — CBC WITH DIFFERENTIAL/PLATELET
BASO%: 0.7 % (ref 0.0–2.0)
EOS%: 3.3 % (ref 0.0–7.0)
HCT: 41.6 % (ref 34.8–46.6)
LYMPH%: 24.8 % (ref 14.0–49.7)
MCH: 29.7 pg (ref 25.1–34.0)
MCHC: 33.7 g/dL (ref 31.5–36.0)
NEUT%: 61.5 % (ref 38.4–76.8)
Platelets: 254 10*3/uL (ref 145–400)
RBC: 4.73 10*6/uL (ref 3.70–5.45)
WBC: 4.8 10*3/uL (ref 3.9–10.3)
lymph#: 1.2 10*3/uL (ref 0.9–3.3)

## 2011-10-10 NOTE — Progress Notes (Signed)
Hematology and Oncology Follow Up Visit  Nancy Pollard [id number] Nov 26, 1965 46 y.o. 10/10/2011 10:42 AM PCP Dr] Livia Snellen Principle Diagnosis: Node negative er/pr+ breast cancer s/p TC x 4 completed with xrt 2/11 on arimidex; s/p oophorectomy 8/12.  Interim History:  There have been no intercurrent illness, hospitalizations or medication changes. She is having migratory pains in her breast on the right side primarily. Medications: I have reviewed the patient's current medications.  Allergies: No Known Allergies  Past Medical History, Surgical history, Social history, and Family History were reviewed and updated.  Review of Systems: Constitutional:  Negative for fever, chills, night sweats, anorexia, weight loss, pain. Cardiovascular: negative Respiratory: negative Neurological: negative Dermatological: negative ENT: negative Skin Gastrointestinal: no abdominal pain, change in bowel habits, or black or bloody stools Genito-Urinary: negative Hematological and Lymphatic: negative Breast: negative Musculoskeletal: negative Remaining ROS negative.  Physical Exam: Blood pressure 95/64, pulse 90, temperature 98.8 F (37.1 C), temperature source Oral, resp. rate 20, height 5\' 8"  (1.727 m), weight 146 lb 9.6 oz (66.497 kg). ECOG: 0 General appearance: alert, cooperative and appears stated age Head: Normocephalic, without obvious abnormality, atraumatic Neck: no adenopathy, no carotid bruit, no JVD, supple, symmetrical, trachea midline and thyroid not enlarged, symmetric, no tenderness/mass/nodules Lymph nodes: Cervical, supraclavicular, and axillary nodes normal. Cardiac : regular rate and rhythm, no murmurs or gallops Pulmonary:clear to auscultation bilaterally and normal percussion bilaterally Breasts: inspection negative, no nipple discharge or bleeding, no masses or nodularity palpable, right breast status post lumpectomy radiation no obvious evidence of  recurrence. Abdomen:soft, non-tender; bowel sounds normal; no masses,  no organomegaly Extremities negative Neuro: alert, oriented, normal speech, no focal findings or movement disorder noted  Lab Results: Lab Results  Component Value Date   WBC 4.8 10/10/2011   HGB 14.0 10/10/2011   HCT 41.6 10/10/2011   MCV 88.0 10/10/2011   PLT 254 10/10/2011     Chemistry      Component Value Date/Time   NA 142 10/10/2011 0921   NA 140 03/25/2011 0839   K 3.8 10/10/2011 0921   K 3.9 03/25/2011 0839   CL 105 10/10/2011 0921   CL 104 03/25/2011 0839   CO2 27 10/10/2011 0921   CO2 25 03/25/2011 0839   BUN 13.0 10/10/2011 0921   BUN 13 03/25/2011 0839   CREATININE 0.9 10/10/2011 0921   CREATININE 0.88 03/25/2011 0839      Component Value Date/Time   CALCIUM 10.0 10/10/2011 0921   CALCIUM 9.4 03/25/2011 0839   ALKPHOS 80 10/10/2011 0921   ALKPHOS 71 03/25/2011 0839   AST 18 10/10/2011 0921   AST 21 03/25/2011 0839   ALT 10 10/10/2011 0921   ALT 12 03/25/2011 0839   BILITOT 1.20 10/10/2011 0921   BILITOT 0.9 03/25/2011 0839      .pathology. Radiological Studies: chest X-ray n/a Mammogram   Bone density 5/13  Impression and Plan: Nancy Pollard is doing well, f/u in 6 months with imaging studies. She is tolerating Arimidex well. Having a few hot flashes.   More than 50% of the visit was spent in patient-related counselling   Pierce Crane, MD 9/30/201310:42 AM

## 2011-10-10 NOTE — Telephone Encounter (Signed)
gve the pt her march,april 2014 appt calendar °

## 2011-10-13 ENCOUNTER — Other Ambulatory Visit: Payer: Self-pay | Admitting: *Deleted

## 2011-10-13 ENCOUNTER — Encounter: Payer: Self-pay | Admitting: Dietician

## 2011-10-13 DIAGNOSIS — C50919 Malignant neoplasm of unspecified site of unspecified female breast: Secondary | ICD-10-CM

## 2011-10-13 MED ORDER — ANASTROZOLE 1 MG PO TABS: 1.0000 mg | ORAL_TABLET | Freq: Every day | ORAL | Status: DC

## 2011-10-13 NOTE — Progress Notes (Signed)
Brief Out-patient Oncology Nutrition Note  Reason: Patient attended breast cancer nutrition course.   I have educated the patient on plant-based diet. I also discussed and provided nutrition handouts and research based evidence on breast cancer nutrition. We discussed nutrition management for symptoms associated with treatment. The class lasted a duration of 1 hour. The patient's asked good questions and were without any further nutrition related questions or concerns. RD contact information was provided. Patient's were instructed to contact RD for future nutrition questions or concerns.   RD available for nutrition needs.   Alvenia Treese D Mekaylah Klich, MS, RD, LDN [phone]  

## 2012-02-01 ENCOUNTER — Telehealth: Payer: Self-pay | Admitting: Oncology

## 2012-02-01 NOTE — Telephone Encounter (Signed)
The patient called and requested to seed Dr. Darnelle Catalan when she is rescheduled.   I cancelled her lab appt and explained to the patient that the new provider would like to see the patient prior to ordering labs.   I emailed Jeff Hefffelfinger her request.

## 2012-02-18 ENCOUNTER — Encounter: Payer: Self-pay | Admitting: *Deleted

## 2012-02-29 ENCOUNTER — Ambulatory Visit (INDEPENDENT_AMBULATORY_CARE_PROVIDER_SITE_OTHER): Payer: 59 | Admitting: General Surgery

## 2012-02-29 ENCOUNTER — Encounter (INDEPENDENT_AMBULATORY_CARE_PROVIDER_SITE_OTHER): Payer: Self-pay | Admitting: General Surgery

## 2012-02-29 VITALS — BP 126/68 | HR 81 | Temp 98.6°F | Resp 18 | Ht 67.5 in | Wt 142.0 lb

## 2012-02-29 DIAGNOSIS — C50919 Malignant neoplasm of unspecified site of unspecified female breast: Secondary | ICD-10-CM

## 2012-02-29 NOTE — Progress Notes (Signed)
Subjective:     Patient ID: Nancy Pollard, female   DOB: Mar 06, 1965, 47 y.o.   MRN: [id number]  HPI The patient is a 47 year old white female who is 3 years status post right lumpectomy and negative sentinel node biopsy for a T1 C. N0 right breast cancer. She continues to experience some pain that comes and goes in the right breast. It is doubling better than it was last time we saw her. She continues to take Arimidex. She tolerates this well. She is scheduled for her next mammogram in June.  Review of Systems  Constitutional: Negative.   HENT: Negative.   Eyes: Negative.   Respiratory: Negative.   Cardiovascular: Negative.   Gastrointestinal: Negative.   Endocrine: Negative.   Genitourinary: Negative.   Musculoskeletal: Negative.   Skin: Negative.   Allergic/Immunologic: Negative.   Neurological: Negative.   Hematological: Negative.   Psychiatric/Behavioral: Negative.        Objective:   Physical Exam  Constitutional: She is oriented to person, place, and time. She appears well-developed and well-nourished.  HENT:  Head: Normocephalic and atraumatic.  Eyes: Conjunctivae and EOM are normal. Pupils are equal, round, and reactive to light.  Neck: Normal range of motion. Neck supple.  Cardiovascular: Normal rate, regular rhythm and normal heart sounds.   Pulmonary/Chest: Effort normal and breath sounds normal.  Her right breast and axillary incisions have healed nicely. There is no palpable mass in either breast. There is no palpable axillary or supraclavicular cervical lymphadenopathy.  Abdominal: Soft. Bowel sounds are normal. She exhibits no mass. There is no tenderness.  Musculoskeletal: Normal range of motion.  Lymphadenopathy:    She has no cervical adenopathy.  Neurological: She is alert and oriented to person, place, and time.  Skin: Skin is warm and dry.  Psychiatric: She has a normal mood and affect. Her behavior is normal.       Assessment:     The patient is 3  years status post right lumpectomy for breast cancer     Plan:     At this point she will continue to do regular self exams. She will continue to take her antiestrogen therapy. We will plan to see her back in 6 months.

## 2012-02-29 NOTE — Patient Instructions (Signed)
Continue regular self exams Continue Arimidex

## 2012-04-05 ENCOUNTER — Other Ambulatory Visit: Payer: 59 | Admitting: Lab

## 2012-04-12 ENCOUNTER — Ambulatory Visit: Payer: 59 | Admitting: Oncology

## 2012-04-13 ENCOUNTER — Ambulatory Visit (HOSPITAL_BASED_OUTPATIENT_CLINIC_OR_DEPARTMENT_OTHER): Payer: 59 | Admitting: Family

## 2012-04-13 ENCOUNTER — Encounter: Payer: Self-pay | Admitting: Family

## 2012-04-13 VITALS — BP 100/68 | HR 86 | Temp 98.4°F | Resp 20 | Ht 67.0 in | Wt 138.9 lb

## 2012-04-13 DIAGNOSIS — Z170 Estrogen receptor positive status [ER+]: Secondary | ICD-10-CM

## 2012-04-13 DIAGNOSIS — C50919 Malignant neoplasm of unspecified site of unspecified female breast: Secondary | ICD-10-CM

## 2012-04-13 DIAGNOSIS — C50911 Malignant neoplasm of unspecified site of right female breast: Secondary | ICD-10-CM

## 2012-04-13 DIAGNOSIS — Z853 Personal history of malignant neoplasm of breast: Secondary | ICD-10-CM

## 2012-04-13 DIAGNOSIS — C50419 Malignant neoplasm of upper-outer quadrant of unspecified female breast: Secondary | ICD-10-CM

## 2012-04-13 MED ORDER — ANASTROZOLE 1 MG PO TABS: 1.0000 mg | ORAL_TABLET | Freq: Every day | ORAL | Status: DC

## 2012-04-13 NOTE — Progress Notes (Signed)
[facility]  Telephone:([phone] Fax:([phone]) [phone]  OFFICE PROGRESS NOTE   ID: Nancy Pollard   DOB: 28-May-1965  MR#: [id number]  JYN#:[id number]   PCP: Nancy Edwards, MD GYN:  Nancy Pollard. Nancy Morgans, MD RAD ONC:  Nancy Blackbird, MD   HISTORY OF PRESENT ILLNESS: From Dr. Guadalupe Pollard New Patient Evaluation Note dated 09/30/2010: "This is a delightful 47 year old woman here today with her husband, Nancy Pollard for evaluation and treatment of breast cancer. This woman has been in excellent health.  She has no underlying medical problems.  She presents today.  A screening mammogram; this was performed at [facility] on 06/30/2008.  This showed a 1.1 cm mass posterior third right breast.  Additional views were recommended.  Additional views on 07/08/2008 showed a hypoechoic microlobulated mass, maximum diameter parallel to chest wall measuring 1.6 cm.  Biopsy was recommended.  Biopsy on 07/09/2008 showed this to be an ER positive at 6%, PR positive at 44% ductal cancer.  A preoperative MRI scan was performed on 07/23/2008.  This shows a 1.6 x 1.3 x 1.2 cm mass 6 o'clock position right breast.  The patient underwent a lumpectomy and sentinel lymph node evaluation 09/02/2008.  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  This was 0.1 cm from the inferior margin.  Lymphovascular invasion was noted.  Five sentinel lymph nodes were identified all of which were negative for malignancy. The patient has had an unremarkable postoperative course."  Her subsequent history is as detailed below.    INTERVAL HISTORY: [name] was seen today for follow up of invasive ductal carcinoma of the right breast diagnosed in 06/2008.  She was last seen by Dr. Donnie Pollard on 10/10/2011.  Since her last office visit the patient has been doing relatively well.  She is establishing herself with Dr. Darrall Pollard service today.  REVIEW OF SYSTEMS: A 10 point review of systems was completed and is  negative except for hot flashes.  The patient denies any other symptomatology.    PAST MEDICAL HISTORY: Past Medical History  Diagnosis Date  . BRCA1 negative   . BRCA2 negative   . Vaginal delivery     2 NSVD'S  . Missed ab     ONE  . Breast cancer     RIGHT BREAST CA/ POSITIVE EST/PROG RECEPTOR  . Osteopenia   . S/P oophorectomy     PAST SURGICAL HISTORY: Past Surgical History  Procedure Laterality Date  . Dilation and curettage of uterus      MISSED AB  . Lapr  bso  7.19.12  . Vagina surgery      stretch  . Breast lumpectomy  09/02/2008    Right lumpectomy+sln,PR+,Her2-,T1cN0    FAMILY HISTORY Family History  Problem Relation Age of Onset  . Thyroid disease Mother   . Cancer Mother 55    br cancer   . Cancer Maternal Uncle     PANCREATIC CANCER  . Cancer Paternal Uncle     LUNG  . Heart disease Maternal Grandmother   . Cancer Maternal Grandmother     UTERINE  . Heart disease Maternal Grandfather   . Hypertension Maternal Grandfather   . Heart disease Paternal Grandmother   . Heart disease Paternal Grandfather   . Hypertension Paternal Grandfather     GYNECOLOGIC HISTORY: G2, P2, menarche age 47, premenopausal with regular periods.  She has two children, a daughter and a son.  She has had genetic  testing, which is negative for the BRCA1 and 2 gene.  SOCIAL HISTORY: Married to husband, Nancy Pollard.  She works at [employer].  They have lived in [place], [place] for over 10 years.  She originally grew up in [place], [place] and in [place], she traveled extensively.   ADVANCED DIRECTIVES:  Not on file  HEALTH MAINTENANCE: History  Substance Use Topics  . Smoking status: Never Smoker   . Smokeless tobacco: Never Used  . Alcohol Use: Yes     Comment: RARELY    Colonoscopy: N/A PAP: 05/27/2009 Bone density: 07/22/2010 Lipid panel:  Not on file  No Known Allergies  Current Outpatient Prescriptions  Medication Sig Dispense Refill  . anastrozole  (ARIMIDEX) 1 MG tablet Take 1 tablet (1 mg total) by mouth daily.  30 tablet  24  . Cholecalciferol (VITAMIN D PO) Take 1,000 Int'l Units by mouth daily.       . Nutritional Supplements (JUICE PLUS FIBRE) LIQD Take by mouth.       No current facility-administered medications for this visit.    OBJECTIVE: Filed Vitals:   04/13/12 1023  BP: 100/68  Pulse: 86  Temp: 98.4 F (36.9 C)  Resp: 20     Body mass index is 21.75 kg/(m^2).      ECOG FS:  Grade 1 - Symptomatic, but fully ambulatory                  General appearance: Alert, cooperative, well nourished, no apparent distress Head: Normocephalic, without obvious abnormality, atraumatic Eyes: Conjunctivae/corneas clear, PERRLA, EOMI Nose: Nares, septum and mucosa are normal, no drainage or sinus tenderness Neck: No adenopathy, supple, symmetrical, trachea midline, thyroid not enlarged, no tenderness Resp: Clear to auscultation bilaterally Cardio: Regular rate and rhythm, S1, S2 normal, no murmur, click, rub or gallop Breasts: Right breast and axillary area have well-healed surgical scars, radiation and architectural changes noted,  no lymphadenopathy, no axilla fullness GI: Soft, non-tender, hypoactive bowel sounds, no organomegaly Extremities: Extremities normal, atraumatic, no cyanosis or edema Lymph nodes: Cervical, supraclavicular, and axillary nodes normal Neurologic: Grossly normal   LAB RESULTS: Lab Results  Component Value Date   WBC 4.8 10/10/2011   NEUTROABS 3.0 10/10/2011   HGB 14.0 10/10/2011   HCT 41.6 10/10/2011   MCV 88.0 10/10/2011   PLT 254 10/10/2011      Chemistry      Component Value Date/Time   NA 142 10/10/2011 0921   NA 140 03/25/2011 0839   K 3.8 10/10/2011 0921   K 3.9 03/25/2011 0839   CL 105 10/10/2011 0921   CL 104 03/25/2011 0839   CO2 27 10/10/2011 0921   CO2 25 03/25/2011 0839   BUN 13.0 10/10/2011 0921   BUN 13 03/25/2011 0839   CREATININE 0.9 10/10/2011 0921   CREATININE 0.88 03/25/2011 0839       Component Value Date/Time   CALCIUM 10.0 10/10/2011 0921   CALCIUM 9.4 03/25/2011 0839   ALKPHOS 80 10/10/2011 0921   ALKPHOS 71 03/25/2011 0839   AST 18 10/10/2011 0921   AST 21 03/25/2011 0839   ALT 10 10/10/2011 0921   ALT 12 03/25/2011 0839   BILITOT 1.20 10/10/2011 0921   BILITOT 0.9 03/25/2011 0839      Lab Results  Component Value Date   LABCA2 26 09/16/2010    Urinalysis No results found for this basename: colorurine,  appearanceur,  labspec,  phurine,  glucoseu,  hgbur,  bilirubinur,  ketonesur,  proteinur,  urobilinogen,  nitrite,  leukocytesur    STUDIES: No results found.  ASSESSMENT: 47 y.o. [place], [place] woman:  1.  Status post right breast needle core biopsy at the 9 o'clock position on 07/10/18/10 which showed invasive mammary carcinoma consistent with invasive ductal carcinoma with lobular features.  2.  Status post bilateral breast MRI on 07/23/2008 which showed a 1.6 x 1.3 x 1.2 cm biopsy-proven invasive ductal carcinoma with lobular features deep in the 6 o'clock position of the right breast.  Otherwise, unremarkable examination.  3.  On 08/25/2008 the patient received a comprehensive BRCA analysis which shows that she was negative for the BRCA1 and BRCA2 gene mutations.  4.  Status post right breast needle localized lumpectomy with sentinel node biopsy on 09/02/2008 for a stage I, 1.6 cm, pT1c pN0, invasive ductal carcinoma, grade 2, ER 6%, PR 44%, Ki-67 16%, HER-2/neu by CISH no implication with a ratio 0.83, 0/5 positive lymph nodes.  5.  The patient received an Oncotype DX dated 10/08/2008 which showed a recurrence score of 34 with average rate of distance recurrence at 23%.  6.  The patient received adjuvant chemotherapy with TC x4 cycles from 10/17/2008 through 12/18/2008.    7.  Status post radiation therapy from 01/13/2009 through 03/02/2009.  8.  The patient started antiestrogen therapy with Tamoxifen in 03/1999 until around 02/2011.  At that time her  antiestrogen therapy was changed to Anastrozole from 02/2011 until the present time.    PLAN: Mrs. Cope is doing well from a breast cancer point of view.  Her last bilateral digital diagnostic mammogram on 06/14/2011 showed stable architectural distortion at the lumpectomy site on the right.  No new or worrisome findings are seen in either breast.  She will be due for another bilateral digital diagnostic mammogram in 06/2012 and we will schedule this for her.   The patient is also due for a bone density scan, and we will schedule this scan for her.  The patient will continue on antiestrogen therapy for a total of 5 years (until 03/2014).  An electronic prescription for Anastrozole 1 mg by mouth daily #30 with 24 refills was sent to the patient's pharmacy.  Unfortunately, the patient had to leave before to go to work before she was able to be seen by Dr. Darnelle Catalan.  We will plan to see her again in 6 months, at which time we will check a CBC, CMP, LDH, and vitamin D level.   All questions were answered.  The patient was encouraged to contact us in the interim with any problems, questions or concerns.   Larina Bras, NP-C 04/25/2012, 8:51 AM

## 2012-04-13 NOTE — Patient Instructions (Addendum)
Please contact us at [phone] if you have any questions or concerns.  Continue taking vitamin D, eat a healthy diet, exercise daily and drink plenty of water.

## 2012-04-16 ENCOUNTER — Other Ambulatory Visit: Payer: Self-pay | Admitting: Oncology

## 2012-04-16 ENCOUNTER — Encounter: Payer: Self-pay | Admitting: Oncology

## 2012-04-16 ENCOUNTER — Telehealth: Payer: Self-pay | Admitting: *Deleted

## 2012-04-16 NOTE — Telephone Encounter (Signed)
sw with pt gv appt d/t. Pt is aware. I informed her that I will place her schedule in the mail as well.

## 2012-04-25 ENCOUNTER — Encounter: Payer: Self-pay | Admitting: Family

## 2012-04-25 ENCOUNTER — Telehealth: Payer: Self-pay | Admitting: Oncology

## 2012-06-07 ENCOUNTER — Encounter: Payer: Self-pay | Admitting: Gynecology

## 2012-06-08 ENCOUNTER — Other Ambulatory Visit (HOSPITAL_COMMUNITY)
Admission: RE | Admit: 2012-06-08 | Discharge: 2012-06-08 | Disposition: A | Discharge status: Home / Self Care | Payer: 59 | Source: Ambulatory Visit | Attending: Gynecology | Admitting: Gynecology

## 2012-06-08 ENCOUNTER — Ambulatory Visit (INDEPENDENT_AMBULATORY_CARE_PROVIDER_SITE_OTHER): Payer: 59 | Admitting: Gynecology

## 2012-06-08 ENCOUNTER — Encounter: Payer: Self-pay | Admitting: Gynecology

## 2012-06-08 VITALS — BP 124/78 | Ht 67.25 in | Wt 130.0 lb

## 2012-06-08 DIAGNOSIS — Z853 Personal history of malignant neoplasm of breast: Secondary | ICD-10-CM

## 2012-06-08 DIAGNOSIS — Z1151 Encounter for screening for human papillomavirus (HPV): Secondary | ICD-10-CM | POA: Insufficient documentation

## 2012-06-08 DIAGNOSIS — Z803 Family history of malignant neoplasm of breast: Secondary | ICD-10-CM

## 2012-06-08 DIAGNOSIS — Z01419 Encounter for gynecological examination (general) (routine) without abnormal findings: Secondary | ICD-10-CM | POA: Insufficient documentation

## 2012-06-08 DIAGNOSIS — M8580 Other specified disorders of bone density and structure, unspecified site: Secondary | ICD-10-CM

## 2012-06-08 DIAGNOSIS — M899 Disorder of bone, unspecified: Secondary | ICD-10-CM

## 2012-06-08 NOTE — Progress Notes (Signed)
Nancy Pollard 02/28/1965 [id number]   History:    47 y.o.  for annual gyn exam with no major GYN complaints. Patient has a history of right lumpectomy for breast cancer (positive estrogen and progesterone receptors) who had completed treatment with chemotherapy and radiation in the past and originally was started on tamoxifen in February 2011 but was later switched by her oncologist Dr. Pierce Crane and she is currently on Arimidex. Patient has been tested in the past for the BRCA1 and BRCA2 gene mutation and was not present. She had a prophylactic laparoscopic BSO last year with pathology report being benign. She is currently taking her calcium and vitamin D. patientdoes her monthly self breast examination. The patient is scheduled for her mammogram and a bone density study within the next couple of weeks. Review of her records indicated that her bone density study in 2012 her lowest T score was -2 at the [facility]. Patient has informed me that her mother last year was diagnosed also with breast cancer.   Past medical history,surgical history, family history and social history were all reviewed and documented in the EPIC chart.  Gynecologic History No LMP recorded. Patient is not currently having periods (Reason: Other). Contraception: Bilateral salpingo-oophorectomy Last Pap: 2013. Results were: normal Last mammogram: 2013. Results were: normal  Obstetric History OB History   Grav Para Term Preterm Abortions TAB SAB Ect Mult Living   3 2 2  1  1   2      # Outc Date GA Lbr Len/2nd Wgt Sex Del Anes PTL Lv   1 TRM     F SVD  No Yes   2 TRM     M SVD  No Yes   3 SAB                ROS: A ROS was performed and pertinent positives and negatives are included in the history.  GENERAL: No fevers or chills. HEENT: No change in vision, no earache, sore throat or sinus congestion. NECK: No pain or stiffness. CARDIOVASCULAR: No chest pain or pressure. No palpitations. PULMONARY: No shortness of breath,  cough or wheeze. GASTROINTESTINAL: No abdominal pain, nausea, vomiting or diarrhea, melena or bright red blood per rectum. GENITOURINARY: No urinary frequency, urgency, hesitancy or dysuria. MUSCULOSKELETAL: No joint or muscle pain, no back pain, no recent trauma. DERMATOLOGIC: No rash, no itching, no lesions. ENDOCRINE: No polyuria, polydipsia, no heat or cold intolerance. No recent change in weight. HEMATOLOGICAL: No anemia or easy bruising or bleeding. NEUROLOGIC: No headache, seizures, numbness, tingling or weakness. PSYCHIATRIC: No depression, no loss of interest in normal activity or change in sleep pattern.     Exam: chaperone present  BP 124/78  Ht 5' 7.25" (1.708 m)  Wt 130 lb (58.968 kg)  BMI 20.21 kg/m2  Body mass index is 20.21 kg/(m^2).  General appearance : Well developed well nourished female. No acute distress HEENT: Neck supple, trachea midline, no carotid bruits, no thyroidmegaly Lungs: Clear to auscultation, no rhonchi or wheezes, or rib retractions  Heart: Regular rate and rhythm, no murmurs or gallops Breast:Examined in sitting and supine position were symmetrical in appearance, no palpable masses or tenderness,  no skin retraction, no nipple inversion, no nipple discharge, no skin discoloration, no axillary or supraclavicular lymphadenopathy Abdomen: no palpable masses or tenderness, no rebound or guarding Extremities: no edema or skin discoloration or tenderness  Pelvic:  Bartholin, Urethra, Skene Glands: Within normal limits  Vagina: No gross lesions or discharge  Cervix: No gross lesions or discharge  Uterus  anteverted, normal size, shape and consistency, non-tender and mobile  Adnexa  Without masses or tenderness  Anus and perineum  normal   Rectovaginal  normal sphincter tone without palpated masses or tenderness             Hemoccult that indicated     Assessment/Plan:  47 y.o. female for annual exam but no abnormalities the on gynecological  exam. Patient will continue with her calcium and vitamin D and regular exercise for osteoporosis prevention. She was scheduled for bone density study mammogram within the next couple of weeks. She will continue to follow with her oncologist. Pap smear was done today.we will check her calcium and vitamin D level today.    Ok Edwards MD, 1:02 PM 06/08/2012

## 2012-06-08 NOTE — Patient Instructions (Addendum)

## 2012-06-09 LAB — VITAMIN D 25 HYDROXY (VIT D DEFICIENCY, FRACTURES): Vit D, 25-Hydroxy: 42 ng/mL (ref 30–89)

## 2012-06-14 ENCOUNTER — Encounter: Payer: Self-pay | Admitting: Gynecology

## 2012-06-15 ENCOUNTER — Encounter (INDEPENDENT_AMBULATORY_CARE_PROVIDER_SITE_OTHER): Payer: Self-pay

## 2012-06-19 ENCOUNTER — Encounter: Payer: Self-pay | Admitting: Gynecology

## 2012-06-21 ENCOUNTER — Ambulatory Visit (INDEPENDENT_AMBULATORY_CARE_PROVIDER_SITE_OTHER): Payer: 59

## 2012-06-21 DIAGNOSIS — M8580 Other specified disorders of bone density and structure, unspecified site: Secondary | ICD-10-CM

## 2012-06-21 DIAGNOSIS — M818 Other osteoporosis without current pathological fracture: Secondary | ICD-10-CM

## 2012-06-25 ENCOUNTER — Encounter: Payer: Self-pay | Admitting: Gynecology

## 2012-06-25 ENCOUNTER — Telehealth: Payer: Self-pay | Admitting: *Deleted

## 2012-06-25 NOTE — Telephone Encounter (Signed)
Lm informing the pt that GCM will be on call 10/16/12. gv appt d/t for 10/22/12@3 :15pm. i also made pt aware that i will mail a letter/cal as well....td

## 2012-06-27 ENCOUNTER — Telehealth: Payer: Self-pay | Admitting: Family

## 2012-06-27 NOTE — Telephone Encounter (Signed)
Received a copy of patient's bone density scan results - called patient to review them with her (if Dr. Lily Peer had not already done so).  LM for her to call me back to review the bone density scan results if she did not know the results.  T-score -2.7 (osteoporosis) Calcium (Caltrate 600 mg BID or Viactiv chews 500 mg (2 chews) daily) 1000 mg - 1200 mg daily + Vitamin D3 2000 IUs + walking for 30 minutes daily is recommended for the patient for osteoporosis.

## 2012-06-29 ENCOUNTER — Telehealth: Payer: Self-pay | Admitting: Oncology

## 2012-07-02 ENCOUNTER — Ambulatory Visit: Payer: 59 | Admitting: Oncology

## 2012-07-02 ENCOUNTER — Other Ambulatory Visit: Payer: 59 | Admitting: Lab

## 2012-07-03 ENCOUNTER — Ambulatory Visit: Payer: 59 | Admitting: Oncology

## 2012-07-03 ENCOUNTER — Other Ambulatory Visit: Payer: 59 | Admitting: Lab

## 2012-07-06 ENCOUNTER — Other Ambulatory Visit (HOSPITAL_COMMUNITY)
Admission: RE | Admit: 2012-07-06 | Discharge: 2012-07-06 | Disposition: A | Discharge status: Home / Self Care | Payer: 59 | Source: Ambulatory Visit | Attending: Gynecology | Admitting: Gynecology

## 2012-07-06 ENCOUNTER — Encounter: Payer: Self-pay | Admitting: Gynecology

## 2012-07-06 ENCOUNTER — Ambulatory Visit (INDEPENDENT_AMBULATORY_CARE_PROVIDER_SITE_OTHER): Payer: 59 | Admitting: Gynecology

## 2012-07-06 DIAGNOSIS — Z01419 Encounter for gynecological examination (general) (routine) without abnormal findings: Secondary | ICD-10-CM | POA: Insufficient documentation

## 2012-07-06 DIAGNOSIS — M810 Age-related osteoporosis without current pathological fracture: Secondary | ICD-10-CM | POA: Insufficient documentation

## 2012-07-06 DIAGNOSIS — R87616 Satisfactory cervical smear but lacking transformation zone: Secondary | ICD-10-CM

## 2012-07-06 MED ORDER — RALOXIFENE HCL 60 MG PO TABS: 60.0000 mg | ORAL_TABLET | Freq: Every day | ORAL | Status: DC

## 2012-07-06 NOTE — Progress Notes (Signed)
Patient was seen in the office in May of this year for her annual exam. Patient has a history of right lumpectomy for breast cancer (positive estrogen and progesterone receptors) who had completed treatment with chemotherapy and radiation in the past and originally was started on tamoxifen in February 2011 but was later switched by her oncologist Dr. Pierce Crane and she is currently on Arimidex. Patient has been tested in the past for the BRCA1 and BRCA2 gene mutation and was not present. She had a prophylactic laparoscopic BSO last year with pathology report being benign. She is currently taking her calcium and vitamin D. Her Pap smear from that visit demonstrated the following:  UNSATISFACTORY for evaluation due to extremely scant squamous cellularity. The specimen is processed and examined microscopically, but is found to be unsatisfactory for evaluation of an epithelial abnormality. Repeat study recommended.  She is here for repeat Pap smear as well as to discuss her bone density results which demonstrated the following :  Lowest T score at the [facility] spine and -2.7 Total left hip significant decrease in bone mineralization of -6% Total right hip significant decrease in bone mineralization of -7.2 percent  Patient's last menstrual period was in October of 2010 when she started chemotherapy. T score instead of Z. Score will be used for evaluation.  Assessment/plan: Pap smear repeated. Patient with evidence of osteoporosis. Based on her findings her bone masses 25% below normal and her risk of the spinal fracture is a times greater than the general population and 11 times greater risk of a hip fracture greater than the general population. Her calcium and vitamin D were normal recently. Based on these findings treatment is recommended. Because of her past history of breast cancer a selective estrogen receptor modulator such as Evista 60 mg daily will be prescribed to help with her osteoporosis as well.  The risks benefits and pros and cons of the medication were discussed as well as literature for patient provided. It was recommended she continue her calcium and vitamin D in regular exercise and a bone density study will be repeated in one year to monitor therapy.

## 2012-07-06 NOTE — Patient Instructions (Addendum)
Osteoporosis Throughout your life, your body breaks down old bone and replaces it with new bone. As you get older, your body does not replace bone as quickly as it breaks it down. By the age of 30 years, most people begin to gradually lose bone because of the imbalance between bone loss and replacement. Some people lose more bone than others. Bone loss beyond a specified normal degree is considered osteoporosis.  Osteoporosis affects the strength and durability of your bones. The inside of the ends of your bones and your flat bones, like the bones of your pelvis, look like honeycomb, filled with tiny open spaces. As bone loss occurs, your bones become less dense. This means that the open spaces inside your bones become bigger and the walls between these spaces become thinner. This makes your bones weaker. Bones of a person with osteoporosis can become so weak that they can break (fracture) during minor accidents, such as a simple fall. CAUSES  The following factors have been associated with the development of osteoporosis:  Smoking.  Drinking more than 2 alcoholic drinks several days per week.  Long-term use of certain medicines:  Corticosteroids.  Chemotherapy medicines.  Thyroid medicines.  Antiepileptic medicines.  Gonadal hormone suppression medicine.  Immunosuppression medicine.  Being underweight.  Lack of physical activity.  Lack of exposure to the sun. This can lead to vitamin D deficiency.  Certain medical conditions:  Certain inflammatory bowel diseases, such as Crohn's disease and ulcerative colitis.  Diabetes.  Hyperthyroidism.  Hyperparathyroidism. RISK FACTORS Anyone can develop osteoporosis. However, the following factors can increase your risk of developing osteoporosis:  Gender Women are at higher risk than men.  Age Being older than 50 years increases your risk.  Ethnicity White and Asian people have an increased risk.  Weight Being extremely  underweight can increase your risk of osteoporosis.  Family history of osteoporosis Having a family member who has developed osteoporosis can increase your risk. SYMPTOMS  Usually, people with osteoporosis have no symptoms.  DIAGNOSIS  Signs during a physical exam that may prompt your caregiver to suspect osteoporosis include:  Decreased height. This is usually caused by the compression of the bones that form your spine (vertebrae) because they have weakened and become fractured.  A curving or rounding of the upper back (kyphosis). To confirm signs of osteoporosis, your caregiver may request a procedure that uses 2 low-dose X-ray beams with different levels of energy to measure your bone mineral density (dual-energy X-ray absorptiometry [DXA]). Also, your caregiver may check your level of vitamin D. TREATMENT  The goal of osteoporosis treatment is to strengthen bones in order to decrease the risk of bone fractures. There are different types of medicines available to help achieve this goal. Some of these medicines work by slowing the processes of bone loss. Some medicines work by increasing bone density. Treatment also involves making sure that your levels of calcium and vitamin D are adequate. PREVENTION  There are things you can do to help prevent osteoporosis. Adequate intake of calcium and vitamin D can help you achieve optimal bone mineral density. Regular exercise can also help, especially resistance and weight-bearing activities. If you smoke, quitting smoking is an important part of osteoporosis prevention. MAKE SURE YOU:  Understand these instructions.  Will watch your condition.  Will get help right away if you are not doing well or get worse. Document Released: 10/06/2004 Document Revised: 12/14/2011 Document Reviewed: 12/11/2010 [facility] Patient Information 2014 [place], [place].   Raloxifene tablets What is  this medicine? RALOXIFENE (ral OX i feen) reduces the amount of  calcium lost from bones. It is used to treat and prevent osteoporosis in women who have experienced menopause. This medicine may be used for other purposes; ask your health care provider or pharmacist if you have questions. What should I tell my health care provider before I take this medicine? They need to know if you have any of these conditions: -a history of blood clots -cancer -heart failure -liver disease -premenopausal -an unusual or allergic reaction to raloxifene, other medicines, foods, dyes, or preservatives -pregnant or trying to get pregnant -breast-feeding How should I use this medicine? Take this medicine by mouth with a glass of water. Follow the directions on the prescription label. The tablets can be taken with or without food. Take your doses at regular intervals. Do not take your medicine more often than directed. Talk to your pediatrician regarding the use of this medicine in children. Special care may be needed. Overdosage: If you think you have taken too much of this medicine contact a poison control center or emergency room at once. NOTE: This medicine is only for you. Do not share this medicine with others. What if I miss a dose? If you miss a dose, take it as soon as you can. If it is almost time for your next dose, take only that dose. Do not take double or extra doses. What may interact with this medicine? -ampicillin -cholestyramine -colestipol -diazepam -diazoxide -female hormones like hormone replacement therapy -lidocaine -warfarin This list may not describe all possible interactions. Give your health care provider a list of all the medicines, herbs, non-prescription drugs, or dietary supplements you use. Also tell them if you smoke, drink alcohol, or use illegal drugs. Some items may interact with your medicine. What should I watch for while using this medicine? Visit your doctor or health care professional for regular checks on your progress. Do not  stop taking this medicine except on the advice of your doctor or health care professional. You should make sure you get enough calcium and vitamin D in your diet while you are taking this medicine. Discuss your dietary needs with your health care professional or nutritionist. Exercise may help to prevent bone loss. Discuss your exercise needs with your doctor or health care professional. This medicine can rarely cause blood clots. You should avoid long periods of bed rest while taking this medicine. If you are going to have surgery, tell your doctor or health care professional that you are taking this medicine. This medicine should be stopped at least 3 days before surgery. After surgery, it should be restarted only after you are walking again. It should not be restarted while you still need long periods of bed rest. You should not smoke while taking this medicine. Smoking may also increase your risk of blood clots. Smoking can also decrease the effects of this medicine. This medicine does not prevent hot flashes. It may cause hot flashes in some patients at the start of therapy. What side effects may I notice from receiving this medicine? Side effects that you should report to your doctor or health care professional as soon as possible: -change in vision -chest pain -difficulty breathing -leg pain or swelling -skin rash, itching Side effects that usually do not require medical attention (report to your doctor or health care professional if they continue or are bothersome): -fluid build-up -leg cramps -stomach pain -sweating This list may not describe all possible side effects. Call your  doctor for medical advice about side effects. You may report side effects to FDA at 1-800-FDA-1088. Where should I keep my medicine? Keep out of the reach of children. Store at room temperature between 15 and 30 degrees C (59 and 86 degrees F). Throw away any unused medicine after the expiration date. NOTE:  This sheet is a summary. It may not cover all possible information. If you have questions about this medicine, talk to your doctor, pharmacist, or health care provider.  2013, Elsevier/Gold Standard. (12/13/2007 3:15:14 PM)

## 2012-07-09 ENCOUNTER — Telehealth: Payer: Self-pay | Admitting: Oncology

## 2012-07-09 ENCOUNTER — Other Ambulatory Visit (HOSPITAL_BASED_OUTPATIENT_CLINIC_OR_DEPARTMENT_OTHER): Payer: 59 | Admitting: Lab

## 2012-07-09 ENCOUNTER — Ambulatory Visit (HOSPITAL_BASED_OUTPATIENT_CLINIC_OR_DEPARTMENT_OTHER): Payer: 59 | Admitting: Oncology

## 2012-07-09 VITALS — BP 94/60 | HR 86 | Temp 98.3°F | Resp 20 | Ht 67.25 in | Wt 137.0 lb

## 2012-07-09 DIAGNOSIS — C50419 Malignant neoplasm of upper-outer quadrant of unspecified female breast: Secondary | ICD-10-CM

## 2012-07-09 DIAGNOSIS — C50519 Malignant neoplasm of lower-outer quadrant of unspecified female breast: Secondary | ICD-10-CM

## 2012-07-09 DIAGNOSIS — Z853 Personal history of malignant neoplasm of breast: Secondary | ICD-10-CM

## 2012-07-09 DIAGNOSIS — C50919 Malignant neoplasm of unspecified site of unspecified female breast: Secondary | ICD-10-CM

## 2012-07-09 DIAGNOSIS — C50411 Malignant neoplasm of upper-outer quadrant of right female breast: Secondary | ICD-10-CM | POA: Insufficient documentation

## 2012-07-09 DIAGNOSIS — C50911 Malignant neoplasm of unspecified site of right female breast: Secondary | ICD-10-CM

## 2012-07-09 LAB — CBC WITH DIFFERENTIAL/PLATELET
BASO%: 0.6 % (ref 0.0–2.0)
Eosinophils Absolute: 0.1 10*3/uL (ref 0.0–0.5)
HCT: 37.6 % (ref 34.8–46.6)
LYMPH%: 25.6 % (ref 14.0–49.7)
MCHC: 33 g/dL (ref 31.5–36.0)
MCV: 86.2 fL (ref 79.5–101.0)
MONO#: 0.6 10*3/uL (ref 0.1–0.9)
MONO%: 9.8 % (ref 0.0–14.0)
NEUT%: 62.2 % (ref 38.4–76.8)
Platelets: 266 10*3/uL (ref 145–400)
RBC: 4.36 10*6/uL (ref 3.70–5.45)
WBC: 6.2 10*3/uL (ref 3.9–10.3)

## 2012-07-09 LAB — COMPREHENSIVE METABOLIC PANEL (CC13)
BUN: 13.6 mg/dL (ref 7.0–26.0)
CO2: 27 mEq/L (ref 22–29)
Calcium: 9.2 mg/dL (ref 8.4–10.4)
Chloride: 103 mEq/L (ref 98–109)
Creatinine: 0.8 mg/dL (ref 0.6–1.1)
Total Bilirubin: 0.7 mg/dL (ref 0.20–1.20)

## 2012-07-09 MED ORDER — TAMOXIFEN CITRATE 20 MG PO TABS: 20.0000 mg | ORAL_TABLET | Freq: Every day | ORAL | Status: DC

## 2012-07-09 NOTE — Progress Notes (Signed)
[facility]  [phone] [phone]) [phone]  OFFICE PROGRESS NOTE   ID: Nancy Pollard   DOB: [birth year]  MR#: [id number]  JYN#:[id number]   PCP: Ok Edwards, MD GYN:  Janeece RiggersOllen Gross. [name], MD RAD ONC:  Antony Blackbird, MD   HISTORY OF PRESENT ILLNESS: From Dr. Guadalupe Maple New Patient Evaluation Note dated 09/30/2010:  "This is a delightful 47 year old woman here today with her husband, Nancy Pollard for evaluation and treatment of breast cancer. This woman has been in excellent health.  She has no underlying medical problems.  She presents today.  A screening mammogram; this was performed at [facility] on 06/30/2008.  This showed a 1.1 cm mass posterior third right breast.  Additional views were recommended.  Additional views on 07/08/2008 showed a hypoechoic microlobulated mass, maximum diameter parallel to chest wall measuring 1.6 cm.  Biopsy was recommended.  Biopsy on 07/09/2008 showed this to be an ER positive at 6%, PR positive at 44% ductal cancer.  A preoperative MRI scan was performed on 07/23/2008.  This shows a 1.6 x 1.3 x 1.2 cm mass 6 o'clock position right breast.  The patient underwent a lumpectomy and sentinel lymph node evaluation 09/02/2008.  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  This was 0.1 cm from the inferior margin.  Lymphovascular invasion was noted.  Five sentinel lymph nodes were identified all of which were negative for malignancy. The patient has had an unremarkable postoperative course."    Her subsequent history is as detailed below.    INTERVAL HISTORY: Nancy Pollard returns today for followup of her breast cancer. Since her last visit here she went off anastrozole, mostly because of bony aches and pains. Since stopping, those pains have improved. She was also written a prescription for Evista by Dr. Lily Peer, but that she actually never started that medication  REVIEW OF SYSTEMS: She has mild seasonal sinus symptoms.  Otherwise aside from the mild hot flashes and moderate vaginal dryness she continues to experience, a detailed review of systems today was noncontributory   PAST MEDICAL HISTORY: Past Medical History  Diagnosis Date  . BRCA1 negative   . BRCA2 negative   . Vaginal delivery     2 NSVD'S  . Missed ab     ONE  . Breast cancer     RIGHT BREAST CA/ POSITIVE EST/PROG RECEPTOR  . Osteopenia   . S/P oophorectomy     PAST SURGICAL HISTORY: Past Surgical History  Procedure Laterality Date  . Dilation and curettage of uterus      MISSED AB  . Lapr  bso  7.19.12  . Vagina surgery      stretch  . Breast lumpectomy  09/02/2008    Right lumpectomy+sln,PR+,Her2-,T1cN0    FAMILY HISTORY Family History  Problem Relation Age of Onset  . Thyroid disease Mother   . Cancer Mother 58    br cancer   . Cancer Maternal Uncle     PANCREATIC CANCER  . Cancer Paternal Uncle     LUNG  . Heart disease Maternal Grandmother   . Cancer Maternal Grandmother     UTERINE  . Heart disease Maternal Grandfather   . Hypertension Maternal Grandfather   . Heart disease Paternal Grandmother   . Heart disease Paternal Grandfather   . Hypertension Paternal Grandfather     GYNECOLOGIC HISTORY: G2, P2, menarche age 63, premenopausal with regular periods.    She has had genetic testing, which is  negative for the BRCA1 and 2 gene.  SOCIAL HISTORY: Her husband, Nancy Pollard, is a retired Civil engineer, contracting. She works at [employer], and also runs a [employer].  They have lived in [place], [place] for over 10 years.  She has two children, a daughter 26 and a son 9. She originally grew up in [place], [place]. 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  ADVANCED DIRECTIVES:  Not on file  HEALTH MAINTENANCE: History  Substance Use Topics  . Smoking status: Never Smoker   . Smokeless tobacco: Never Used  . Alcohol Use: Yes     Comment: RARELY    Colonoscopy: N/A PAP: 05/27/2009 Bone density:  07/22/2010 Lipid panel:  Not on file  No Known Allergies  Current Outpatient Prescriptions  Medication Sig Dispense Refill  . anastrozole (ARIMIDEX) 1 MG tablet Take 1 tablet (1 mg total) by mouth daily.  30 tablet  24  . Cholecalciferol (VITAMIN D PO) Take 1,000 Int'l Units by mouth daily.       . Nutritional Supplements (JUICE PLUS FIBRE) LIQD Take by mouth.      . raloxifene (EVISTA) 60 MG tablet Take 1 tablet (60 mg total) by mouth daily.  30 tablet  11   No current facility-administered medications for this visit.    OBJECTIVE: Middle-aged white woman who looks well Filed Vitals:   07/09/12 1515  BP: 94/60  Pulse: 86  Temp: 98.3 F (36.8 C)  Resp: 20     Body mass index is 21.3 kg/(m^2).      ECOG FS:  0                 Sclerae unicteric Oropharynx clear No cervical or supraclavicular adenopathy Lungs no rales or rhonchi Heart regular rate and rhythm Abd benign MSK no focal spinal tenderness, no peripheral edema Neuro: nonfocal Breasts: The right breast is status post lumpectomy and radiation. There is no evidence of local recurrence. The left breast is unremarkable.   LAB RESULTS: Lab Results  Component Value Date   WBC 6.2 07/09/2012   NEUTROABS 3.9 07/09/2012   HGB 12.4 07/09/2012   HCT 37.6 07/09/2012   MCV 86.2 07/09/2012   PLT 266 07/09/2012      Chemistry      Component Value Date/Time   NA 139 07/09/2012 1428   NA 140 03/25/2011 0839   K 3.7 07/09/2012 1428   K 3.9 03/25/2011 0839   CL 105 10/10/2011 0921   CL 104 03/25/2011 0839   CO2 27 07/09/2012 1428   CO2 25 03/25/2011 0839   BUN 13.6 07/09/2012 1428   BUN 13 03/25/2011 0839   CREATININE 0.8 07/09/2012 1428   CREATININE 0.88 03/25/2011 0839      Component Value Date/Time   CALCIUM 9.2 07/09/2012 1428   CALCIUM 9.2 06/08/2012 1027   ALKPHOS 72 07/09/2012 1428   ALKPHOS 71 03/25/2011 0839   AST 17 07/09/2012 1428   AST 21 03/25/2011 0839   ALT 7 07/09/2012 1428   ALT 12 03/25/2011 0839   BILITOT 0.70  07/09/2012 1428   BILITOT 0.9 03/25/2011 0839      Lab Results  Component Value Date   LABCA2 26 09/16/2010    Urinalysis No results found for this basename: colorurine,  appearanceur,  labspec,  phurine,  glucoseu,  hgbur,  bilirubinur,  ketonesur,  proteinur,  urobilinogen,  nitrite,  leukocytesur    STUDIES: Bone density 06/21/2012 at [facility] showed osteoporosis, with  a T score at the spine of -2.7, and at the left femoral neck of -1.7. Mammography 06/14/2012 was unremarkable  ASSESSMENT: 47 y.o. BRCA negative [place] woman:  1.  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 Oncotype DX score of 34 predicted a distant recurrence at 23% within 10 years if the patient's only systemic treatment is tamoxifen for 5 years.  3.  The patient received adjuvant chemotherapy with docetaxel and cyclophosphamide x4 cycles from 10/17/2008 through 12/18/2008.    4.  Status post radiation therapy from 01/13/2009 through 03/02/2009.  5.  The patient took Tamoxifen 03/2009 to OCT 2012.  At that time her antiestrogen therapy was changed to Anastrozole , continued until May 2014. She resumed tamoxifen July 2014.  PLAN: Vanette is doing well from a breast cance rpoint of view. She stopped anastrozole because of aches and pains and concerns regarding osteoporosis. We spent the better part of today's 40 minute visit discussing her options and she understands 3 years of tamoxifen plus 2 years of an aromatase inhibitor is optimal. She did not quite receive 2 years of anastrozole but is close.  Accordingly we are going back to tamoxifen and will continue that to OCT 2016. Tamoxifen of course will strengthen her bones, so she does not need to take raloxifene/ Evista as well. Because tamoxifen works in the presence of estrogen  she can use vagifem suppositories of ESTRING while on tamoxifen, and she will discuss this with Dr Lily Peer.   Otherwise Makaylah will see [place] again January 2015. She knows to call for any problems that may develop before her next visit here.  Lowella Dell, MD   07/09/2012, 3:39 PM

## 2012-07-09 NOTE — Telephone Encounter (Signed)
, °

## 2012-10-09 ENCOUNTER — Other Ambulatory Visit: Payer: 59 | Admitting: Lab

## 2012-10-16 ENCOUNTER — Ambulatory Visit: Payer: 59 | Admitting: Oncology

## 2012-10-22 ENCOUNTER — Ambulatory Visit: Payer: 59 | Admitting: Physician Assistant

## 2012-12-10 ENCOUNTER — Telehealth: Payer: Self-pay | Admitting: *Deleted

## 2012-12-10 NOTE — Telephone Encounter (Signed)
Pt called want her appt for 01/2013 to moved to 12/2012 due to her insurance will be changing at the beginning of the year. gv appt for 12/24/12 w/ labs @ 8:15am and ov@ 8:45am.pt is aware...td

## 2012-12-21 ENCOUNTER — Other Ambulatory Visit: Payer: Self-pay | Admitting: Physician Assistant

## 2012-12-21 DIAGNOSIS — C50411 Malignant neoplasm of upper-outer quadrant of right female breast: Secondary | ICD-10-CM

## 2012-12-24 ENCOUNTER — Ambulatory Visit (HOSPITAL_BASED_OUTPATIENT_CLINIC_OR_DEPARTMENT_OTHER): Payer: 59 | Admitting: Physician Assistant

## 2012-12-24 ENCOUNTER — Encounter (INDEPENDENT_AMBULATORY_CARE_PROVIDER_SITE_OTHER): Payer: Self-pay

## 2012-12-24 ENCOUNTER — Other Ambulatory Visit (HOSPITAL_BASED_OUTPATIENT_CLINIC_OR_DEPARTMENT_OTHER): Payer: 59

## 2012-12-24 ENCOUNTER — Telehealth: Payer: Self-pay | Admitting: Oncology

## 2012-12-24 ENCOUNTER — Ambulatory Visit (HOSPITAL_COMMUNITY)
Admission: RE | Admit: 2012-12-24 | Discharge: 2012-12-24 | Disposition: A | Discharge status: Home / Self Care | Payer: 59 | Source: Ambulatory Visit | Attending: Physician Assistant | Admitting: Physician Assistant

## 2012-12-24 ENCOUNTER — Encounter: Payer: Self-pay | Admitting: Physician Assistant

## 2012-12-24 ENCOUNTER — Telehealth: Payer: Self-pay | Admitting: *Deleted

## 2012-12-24 VITALS — BP 100/67 | HR 94 | Temp 98.4°F | Resp 18 | Ht 67.25 in | Wt 132.8 lb

## 2012-12-24 DIAGNOSIS — M25552 Pain in left hip: Secondary | ICD-10-CM

## 2012-12-24 DIAGNOSIS — C50411 Malignant neoplasm of upper-outer quadrant of right female breast: Secondary | ICD-10-CM

## 2012-12-24 DIAGNOSIS — M25559 Pain in unspecified hip: Secondary | ICD-10-CM | POA: Insufficient documentation

## 2012-12-24 DIAGNOSIS — M810 Age-related osteoporosis without current pathological fracture: Secondary | ICD-10-CM

## 2012-12-24 DIAGNOSIS — C50919 Malignant neoplasm of unspecified site of unspecified female breast: Secondary | ICD-10-CM

## 2012-12-24 DIAGNOSIS — N644 Mastodynia: Secondary | ICD-10-CM

## 2012-12-24 DIAGNOSIS — Z170 Estrogen receptor positive status [ER+]: Secondary | ICD-10-CM

## 2012-12-24 LAB — CBC WITH DIFFERENTIAL/PLATELET
Basophils Absolute: 0.1 10*3/uL (ref 0.0–0.1)
Eosinophils Absolute: 0.2 10*3/uL (ref 0.0–0.5)
HGB: 13.3 g/dL (ref 11.6–15.9)
LYMPH%: 23.9 % (ref 14.0–49.7)
MONO#: 0.7 10*3/uL (ref 0.1–0.9)
MONO%: 12.7 % (ref 0.0–14.0)
NEUT#: 3.1 10*3/uL (ref 1.5–6.5)
Platelets: 241 10*3/uL (ref 145–400)
RBC: 4.54 10*6/uL (ref 3.70–5.45)
RDW: 12.8 % (ref 11.2–14.5)
WBC: 5.3 10*3/uL (ref 3.9–10.3)
lymph#: 1.3 10*3/uL (ref 0.9–3.3)

## 2012-12-24 LAB — COMPREHENSIVE METABOLIC PANEL (CC13)
Albumin: 4.1 g/dL (ref 3.5–5.0)
Alkaline Phosphatase: 66 U/L (ref 40–150)
BUN: 16.3 mg/dL (ref 7.0–26.0)
CO2: 29 mEq/L (ref 22–29)
Calcium: 9.7 mg/dL (ref 8.4–10.4)
Chloride: 105 mEq/L (ref 98–109)
Glucose: 67 mg/dl — ABNORMAL LOW (ref 70–140)
Potassium: 3.9 mEq/L (ref 3.5–5.1)
Sodium: 143 mEq/L (ref 136–145)
Total Bilirubin: 0.96 mg/dL (ref 0.20–1.20)
Total Protein: 7.1 g/dL (ref 6.4–8.3)

## 2012-12-24 MED ORDER — TAMOXIFEN CITRATE 20 MG PO TABS: 20.0000 mg | ORAL_TABLET | Freq: Every day | ORAL | Status: DC

## 2012-12-24 NOTE — Telephone Encounter (Signed)
Per PA-C request. Called patient to inform her that her left hip X-ray looked fine. Told patient if pain persists, she may want to be referred to orthopedic physician. Left message to answering service.If pt has questions she can call us at [phone].

## 2012-12-24 NOTE — Progress Notes (Signed)
[facility]  [phone] [phone]  OFFICE PROGRESS NOTE   ID: Nancy Pollard   DOB: [birth year]  MR#: [id number]  ZHY#:[id number]   PCP: Nancy Edwards, MD GYN: Nancy Minium, MD SU: Nancy Pollard. Nancy Morgans, MD RAD ONC:  Nancy Blackbird, MD   HISTORY OF PRESENT ILLNESS: From Dr. Guadalupe Pollard New Patient Evaluation Note dated 09/30/2010:  "This is a delightful 47 year old woman here today with her husband, Nancy Pollard for evaluation and treatment of breast cancer. This woman has been in excellent health.  She has no underlying medical problems.  She presents today.  A screening mammogram; this was performed at [facility] on 06/30/2008.  This showed a 1.1 cm mass posterior third right breast.  Additional views were recommended.  Additional views on 07/08/2008 showed a hypoechoic microlobulated mass, maximum diameter parallel to chest wall measuring 1.6 cm.  Biopsy was recommended.  Biopsy on 07/09/2008 showed this to be an ER positive at 6%, PR positive at 44% ductal cancer.  A preoperative MRI scan was performed on 07/23/2008.  This shows a 1.6 x 1.3 x 1.2 cm mass 6 o'clock position right breast.  The patient underwent a lumpectomy and sentinel lymph node evaluation 09/02/2008.  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  This was 0.1 cm from the inferior margin.  Lymphovascular invasion was noted.  Five sentinel lymph nodes were identified all of which were negative for malignancy. The patient has had an unremarkable postoperative course."    Her subsequent history is as detailed below.    INTERVAL HISTORY: Nancy Pollard returns alone today for followup of her right breast cancer. She was last seen here 6 months ago in June 2014. At that time, she had stopped her anastrozole secondary to bony aches and pains, and the plan was to resume tamoxifen. She tells me, however, that she read all of the side effects, and chose not to begin the tamoxifen. She's here today  for further evaluation, and to review her treatment plan.  Physically, Nancy Pollard biggest complaint today is some pain in her left groin. This started soon after the Nancy Pollard run. It hurts primarily when she sits in her car for prolonged period time, but not necessarily when she sits in other chairs. It does seem to be positional. Activity does not necessarily increase the pain, and she is still able to go up and down stairs. She's had no swelling in the left leg, and denies any palpable abnormalities in the area.   REVIEW OF SYSTEMS: Otherwise, Nancy Pollard has had no recent illnesses and denies any fevers or chills. She's had no problems with hot flashes. She denies any abnormal bruising or bleeding, and specifically denies any abnormal vaginal bleeding. Her energy level is good. She's had no problems with nausea, emesis, or change in bowel or bladder habits. She denies any cough, shortness of breath, chest pain, palpitations. She's had no abnormal headaches, dizziness, or change in vision. She has some occasional postsurgical pain in the right breast which is stable.  Other than the groin pain noted above, she has had no additional myalgias, arthralgias, or bony pain. She denies any back pain. She's had no peripheral swelling.  A detailed review of systems is otherwise stable and noncontributory.   PAST MEDICAL HISTORY: Past Medical History  Diagnosis Date  . BRCA1 negative   . BRCA2 negative   . Vaginal delivery     2 NSVD'S  . Missed ab  ONE  . Breast cancer     RIGHT BREAST CA/ POSITIVE EST/PROG RECEPTOR  . Osteopenia   . S/P oophorectomy     PAST SURGICAL HISTORY: Past Surgical History  Procedure Laterality Date  . Dilation and curettage of uterus      MISSED AB  . Lapr  bso  7.19.12  . Vagina surgery      stretch  . Breast lumpectomy  09/02/2008    Right lumpectomy+sln,PR+,Her2-,T1cN0    FAMILY HISTORY Family History  Problem Relation Age of Onset  . Thyroid disease Mother    . Cancer Mother 47    br cancer   . Cancer Maternal Uncle     PANCREATIC CANCER  . Cancer Paternal Uncle     LUNG  . Heart disease Maternal Grandmother   . Cancer Maternal Grandmother     UTERINE  . Heart disease Maternal Grandfather   . Hypertension Maternal Grandfather   . Heart disease Paternal Grandmother   . Heart disease Paternal Grandfather   . Hypertension Paternal Grandfather     GYNECOLOGIC HISTORY:  (Updated 12/24/2012) G2, P2, menarche age 57, premenopausal with regular periods at time of diagnosis. Status post bilateral salpingo-oophorectomy in July 2012.  She has had genetic testing, which is negative for the BRCA1 and 2 gene.  SOCIAL HISTORY:  (Updated 12/24/2012) Her husband, Nancy Pollard, is a retired Civil engineer, contracting. She works at [employer] in [place], and also runs a [employer].  They have lived in [place], [place] for over 10 years.  She has two children, a daughter 47 and a son 79. She originally grew up in [place], [place]. 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  ADVANCED DIRECTIVES:  Not on file  HEALTH MAINTENANCE: History  Substance Use Topics  . Smoking status: Never Smoker   . Smokeless tobacco: Never Used  . Alcohol Use: Yes     Comment: RARELY    Colonoscopy: N/A PAP: June 2014/Nancy Pollard Bone density: 06/21/2012, osteoporosis with a T score of -2.7 Lipid panel:  Not on file    No Known Allergies  Current Outpatient Prescriptions  Medication Sig Dispense Refill  . Cholecalciferol (VITAMIN D PO) Take 1,000 Int'l Units by mouth daily.       . Nutritional Supplements (JUICE PLUS FIBRE) LIQD Take by mouth.      . tamoxifen (NOLVADEX) 20 MG tablet Take 1 tablet (20 mg total) by mouth daily.  90 tablet  3   No current facility-administered medications for this visit.    OBJECTIVE: Middle-aged white woman who looks well and is in no acute distress Filed Vitals:   12/24/12 0836  BP: 100/67  Pulse: 94  Temp: 98.4 F  (36.9 C)  Resp: 18     Body mass index is 20.65 kg/(m^2).     ECOG FS:  0 Filed Weights   12/24/12 0836  Weight: 132 lb 12.8 oz (60.238 kg)   Physical Exam: HEENT:  Sclerae anicteric.  Oropharynx clear and moist.  Neck supple. NODES:  No cervical or supraclavicular lymphadenopathy palpated. No inguinal lymphadenopathy palpated. BREAST EXAM:  Right breast is status post lumpectomy with no suspicious nodularities or skin changes. No evidence of local recurrence. Left breast is unremarkable. Axillae are benign bilaterally, with no palpable lymphadenopathy. LUNGS:  Clear to auscultation bilaterally with good excursion..  No wheezes or rhonchi HEART:  Regular rate and rhythm. No murmur  ABDOMEN:  Soft, nontender.  Positive bowel sounds.  MSK:  No focal  spinal tenderness to palpation. There is no significant pain to palpation in the left groin. No notable swelling. Good range of motion bilaterally in the lower extremities. Able to straight leg raise bilaterally with no pain or difficulty. EXTREMITIES:  No peripheral edema.   SKIN:  Benign, with no visible rashes or skin changes and no petechiae or ecchymoses. NEURO:  Nonfocal. Well oriented.  Appropriate affect.     LAB RESULTS: Lab Results  Component Value Date   WBC 5.3 12/24/2012   NEUTROABS 3.1 12/24/2012   HGB 13.3 12/24/2012   HCT 40.3 12/24/2012   MCV 88.6 12/24/2012   PLT 241 12/24/2012      Chemistry      Component Value Date/Time   NA 143 12/24/2012 0813   NA 140 03/25/2011 0839   K 3.9 12/24/2012 0813   K 3.9 03/25/2011 0839   CL 105 10/10/2011 0921   CL 104 03/25/2011 0839   CO2 29 12/24/2012 0813   CO2 25 03/25/2011 0839   BUN 16.3 12/24/2012 0813   BUN 13 03/25/2011 0839   CREATININE 0.8 12/24/2012 0813   CREATININE 0.88 03/25/2011 0839      Component Value Date/Time   CALCIUM 9.7 12/24/2012 0813   CALCIUM 9.2 06/08/2012 1027   ALKPHOS 66 12/24/2012 0813   ALKPHOS 71 03/25/2011 0839   AST 20 12/24/2012 0813    AST 21 03/25/2011 0839   ALT 13 12/24/2012 0813   ALT 12 03/25/2011 0839   BILITOT 0.96 12/24/2012 0813   BILITOT 0.9 03/25/2011 0839       STUDIES: Bone density 06/21/2012 at [facility] showed osteoporosis, with a T score at the spine of -2.7, and at the left femoral neck of -1.7.   Bilateral diagnostic mammogram at [facility] on 06/14/2012 was unremarkable.   ASSESSMENT: 47 y.o. BRCA negative [place] woman:  1.  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 Oncotype DX score of 34 predicted a distant recurrence at 23% within 10 years if the patient's only systemic treatment is tamoxifen for 5 years.  3.  The patient received adjuvant chemotherapy with docetaxel and cyclophosphamide x4 cycles from 10/17/2008 through 12/18/2008.    4.  Status post radiation therapy from 01/13/2009 through 03/02/2009.  5.  The patient took Tamoxifen 03/2009 to OCT 2012.  At that time her antiestrogen therapy was changed to Anastrozole , continued until May 2014, when it was discontinued due to poor tolerance. The plan is to restart tamoxifen, although the patient had not yet restarted that medication as of December 2014. Plans to resume at this time.  6. status post bilateral salpingo-oophorectomy in July 2012  7. pain in the left groin/hip  PLAN: With regards to her breast cancer, Tiffine appears to be doing well with no clinical evidence of disease recurrence.   Over half of our 35 minute appointment today was spent again reviewing possible side effects and toxicities associated with tamoxifen, and also discussing its benefits. I have refilled that medication for her for another year, and she does plan to restart the tamoxifen. She understands that while she is on the drug, she will need to contact us with any abnormal  swelling, especially in the lower extremities. She also has annual pelvic exams, possibly with annual pelvic ultrasounds as well, and she will contact  her GYN immediately with any abnormal bleeding. She does have her eyes exam on an annual basis as well.   Original goal was to continue tamoxifen until October of 2016. She will return next June to see Dr. Darnelle Catalan, and they will discuss her treatment plan at that time, and decide whether or not to extend that time since she has been off antiestrogen therapy now for a little more than 6 months. (Between May and December 2014)  I am also sending Quiara for a plain film x-ray of the left hip today to evaluate the pain care. If it is unremarkable, I would recommend either a referral to an orthopedic specialist, or back to her primary care physician.  All this was reviewed in detail with Jaylie today who voices understanding and agreement with our plan. She knows to call for any problems that may develop before her next visit here.  Kilian Schwartz, PA-C   12/24/2012, 9:32 AM

## 2013-01-31 ENCOUNTER — Telehealth: Payer: Self-pay | Admitting: *Deleted

## 2013-01-31 DIAGNOSIS — C50919 Malignant neoplasm of unspecified site of unspecified female breast: Secondary | ICD-10-CM

## 2013-01-31 DIAGNOSIS — M25559 Pain in unspecified hip: Secondary | ICD-10-CM

## 2013-01-31 DIAGNOSIS — G8929 Other chronic pain: Secondary | ICD-10-CM

## 2013-01-31 NOTE — Telephone Encounter (Signed)
Pt called stating continued discomfort with noted increase of left hip/leg pain now causing larger area of discomfort of pains down leg and into pelvis area.  Pain is most noticeable when she lays on her left side or sits-now almost consistantly.  When she lays on her right side she has to continually reposition her left leg for comfort.  Pt had xray showing no abnormalities.  Per note from AB/PA recommended referral to an orthopedist if pain continues.  This note will be given to MD for review and recommendation.  Return call number given as [phone].

## 2013-02-01 ENCOUNTER — Other Ambulatory Visit: Payer: 59 | Admitting: Lab

## 2013-02-01 ENCOUNTER — Ambulatory Visit: Payer: 59 | Admitting: Physician Assistant

## 2013-02-08 ENCOUNTER — Encounter: Payer: Self-pay | Admitting: Women's Health

## 2013-02-08 ENCOUNTER — Ambulatory Visit (INDEPENDENT_AMBULATORY_CARE_PROVIDER_SITE_OTHER): Payer: BC Managed Care – PPO | Admitting: Women's Health

## 2013-02-08 ENCOUNTER — Ambulatory Visit (INDEPENDENT_AMBULATORY_CARE_PROVIDER_SITE_OTHER): Payer: BC Managed Care – PPO

## 2013-02-08 DIAGNOSIS — Z853 Personal history of malignant neoplasm of breast: Secondary | ICD-10-CM

## 2013-02-08 DIAGNOSIS — R1032 Left lower quadrant pain: Secondary | ICD-10-CM

## 2013-02-08 DIAGNOSIS — N949 Unspecified condition associated with female genital organs and menstrual cycle: Secondary | ICD-10-CM

## 2013-02-08 DIAGNOSIS — N938 Other specified abnormal uterine and vaginal bleeding: Secondary | ICD-10-CM

## 2013-02-08 LAB — WET PREP FOR TRICH, YEAST, CLUE
Clue Cells Wet Prep HPF POC: NONE SEEN
Trich, Wet Prep: NONE SEEN
YEAST WET PREP: NONE SEEN

## 2013-02-08 NOTE — Progress Notes (Signed)
Patient ID: Nancy Pollard, female   DOB: 06/30/1965, 47 y.o.   MRN: [id number] Presents with complaint of left lower quadrant discomfort for past several months, tenderness with pressure, clothing, negative hip x-ray 12/2012. States something is not right, a change. Positional, sometimes unable to lay on the left side. Denies constipation, urinary symptoms, change in discharge, fever. 06/2008 right breast cancer, currently on tamoxifen. 2012 BSO. BRCA. 1 and 2 negative. Right red blood on toilet tissue x1 with bowel movement.  Exam: Appears uncomfortable. External genitalia within normal limits, speculum exam scant white discharge, wet prep negative. Bimanual no CMT, uterus small, mobile nontender. No adnexal fullness noted, left-sided tenderness.  Ultrasound: BSO, retroverted uterus. Endometrium is slightly increased with small cystic areas. Endometrium 6.5 mm. At a Normal no free fluid or mass noted. Dr. Fernandez in, endometrial biopsy, tolerated well.  Left lower quadrant pain  Plan: Will call with biopsy results. Has noted bright red blood times one on toilet tissue. Will schedule GI consult for possible colonoscopy. 

## 2013-02-08 NOTE — Telephone Encounter (Signed)
Referral ordered for patient to see Dr Wynelle Link per Dr Jana Hakim. Notes to be faxed to [phone].

## 2013-02-08 NOTE — Addendum Note (Signed)
Addended by: Lannette Donath E on: 02/08/2013 02:47 PM   Modules accepted: Orders

## 2013-02-14 ENCOUNTER — Encounter: Payer: Self-pay | Admitting: Internal Medicine

## 2013-02-14 ENCOUNTER — Telehealth: Payer: Self-pay | Admitting: *Deleted

## 2013-02-14 DIAGNOSIS — K921 Melena: Secondary | ICD-10-CM

## 2013-02-14 DIAGNOSIS — R1032 Left lower quadrant pain: Secondary | ICD-10-CM

## 2013-02-14 NOTE — Telephone Encounter (Signed)
Appointment on with Dr.Pyrtle 03/19/13 @ 9:45 am, pt will be placed on cancellation list. Pt informed with this as well.

## 2013-02-14 NOTE — Telephone Encounter (Signed)
Message copied by Thamas Jaegers on Thu Feb 14, 2013  8:44 AM ------      Message from: [place], [place] J      Created: Fri Feb 08, 2013 12:19 PM       Please schedule GI consult with Dr Collene Mares or [facility] or [facility] for blood in stool x 1 and llq pain for several months with neg hip xray, neg pelvic/gyn [place].  Hx of rt breast ca 2010, BSO 2012.  Dates she can go are next wed am, or thur, fri.  Following week tue pm, wed or Thursday. She lives here but works in [employer] ------

## 2013-03-19 ENCOUNTER — Ambulatory Visit: Payer: 59 | Admitting: Internal Medicine

## 2013-06-17 ENCOUNTER — Ambulatory Visit (INDEPENDENT_AMBULATORY_CARE_PROVIDER_SITE_OTHER): Payer: BC Managed Care – PPO | Admitting: Gynecology

## 2013-06-17 ENCOUNTER — Encounter: Payer: Self-pay | Admitting: Gynecology

## 2013-06-17 VITALS — BP 126/80 | Ht 67.75 in | Wt 138.0 lb

## 2013-06-17 DIAGNOSIS — Z01419 Encounter for gynecological examination (general) (routine) without abnormal findings: Secondary | ICD-10-CM

## 2013-06-17 DIAGNOSIS — Z853 Personal history of malignant neoplasm of breast: Secondary | ICD-10-CM

## 2013-06-17 DIAGNOSIS — M810 Age-related osteoporosis without current pathological fracture: Secondary | ICD-10-CM

## 2013-06-17 NOTE — Patient Instructions (Signed)
Tetanus, Diphtheria (Td) Vaccine What You Need to Know WHY GET VACCINATED? Tetanus  and diphtheria are very serious diseases. They are rare in the [place] today, but people who do become infected often have severe complications. Td vaccine is used to protect adolescents and adults from both of these diseases. Both tetanus and diphtheria are infections caused by bacteria. Diphtheria spreads from person to person through coughing or sneezing. Tetanus-causing bacteria enter the body through cuts, scratches, or wounds. TETANUS (Lockjaw) causes painful muscle tightening and stiffness, usually all over the body.  It can lead to tightening of muscles in the head and neck so you can't open your mouth, swallow, or sometimes even breathe. Tetanus kills about 1 out of every 5 people who are infected. DIPHTHERIA can cause a thick coating to form in the back of the throat.  It can lead to breathing problems, paralysis, heart failure, and death. Before vaccines, the [place] saw as many as 200,000 cases a year of diphtheria and hundreds of cases of tetanus. Since vaccination began, cases of both diseases have dropped by about 99%. TD VACCINE Td vaccine can protect adolescents and adults from tetanus and diphtheria. Td is usually given as a booster dose every 10 years but it can also be given earlier after a severe and dirty wound or burn. Your doctor can give you more information. Td may safely be given at the same time as other vaccines. SOME PEOPLE SHOULD NOT GET THIS VACCINE  If you ever had a life-threatening allergic reaction after a dose of any tetanus or diphtheria containing vaccine, OR if you have a severe allergy to any part of this vaccine, you should not get Td. Tell your doctor if you have any severe allergies.  Talk to your doctor if you:  have epilepsy or another nervous system problem,  had severe pain or swelling after any vaccine containing diphtheria or tetanus,  ever had  Guillain Barr Syndrome (GBS),  aren't feeling well on the day the shot is scheduled. RISKS OF A VACCINE REACTION With a vaccine, like any medicine, there is a chance of side effects. These are usually mild and go away on their own. Serious side effects are also possible, but are very rare. Most people who get Td vaccine do not have any problems with it. Mild Problems  following Td (Did not interfere with activities)  Pain where the shot was given (about 8 people in 10)  Redness or swelling where the shot was given (about 1 person in 3)  Mild fever (about 1 person in 15)  Headache or Tiredness (uncommon) Moderate Problems following Td (Interfered with activities, but did not require medical attention)  Fever over 102 F (38.9 C) (rare) Severe Problems  following Td (Unable to perform usual activities; required medical attention)  Swelling, severe pain, bleeding, or redness in the arm where the shot was given (rare). Problems that could happen after any vaccine:  Brief fainting spells can happen after any medical procedure, including vaccination. Sitting or lying down for about 15 minutes can help prevent fainting, and injuries caused by a fall. Tell your doctor if you feel dizzy, or have vision changes or ringing in the ears.  Severe shoulder pain and reduced range of motion in the arm where a shot was given can happen, very rarely, after a vaccination.  Severe allergic reactions from a vaccine are very rare, estimated at less than 1 in a million doses. If one were to occur, it would   usually be within a few minutes to a few hours after the vaccination. WHAT IF THERE IS A SERIOUS REACTION? What should I look for?  Look for anything that concerns you, such as signs of a severe allergic reaction, very high fever, or behavior changes. Signs of a severe allergic reaction can include hives, swelling of the face and throat, difficulty breathing, a fast heartbeat, dizziness, and  weakness. These would usually start a few minutes to a few hours after the vaccination. What should I do?  If you think it is a severe allergic reaction or other emergency that can't wait, call 911 or get the person to the nearest hospital. Otherwise, call your doctor.  Afterward, the reaction should be reported to the Vaccine Adverse Event Reporting System (VAERS). Your doctor might file this report, or, you can do it yourself through the VAERS website or by calling 1-800-822-7967. VAERS is only for reporting reactions. They do not give medical advice. THE NATIONAL VACCINE INJURY COMPENSATION PROGRAM The National Vaccine Injury Compensation Program (VICP) is a federal program that was created to compensate people who may have been injured by certain vaccines. Persons who believe they may have been injured by a vaccine can learn about the program and about filing a claim by calling 1-800-338-2382 or visiting the VICP website. HOW CAN I LEARN MORE?  Ask your doctor.  Contact your local or state health department.  Contact the Centers for Disease Control and Prevention (CDC):  Call 1-800-232-4636 (1-800-CDC-INFO)  Visit CDC's vaccines website CDC Td Vaccine Interim VIS (02/14/12) Document Released: 10/24/2005 Document Revised: 04/23/2012 Document Reviewed: 04/18/2012 ExitCare Patient Information 2014 [employer].  

## 2013-06-17 NOTE — Progress Notes (Signed)
Nancy Pollard 18-Mar-1965 [id number]   History:    48 y.o.  for annual gyn exam who has a history of right lumpectomy for breast cancer (positive estrogen and progesterone receptors) who had completed treatment with chemotherapy and radiation in the past and originally was started on tamoxifen in February 2011. The patient had been seen in the office in January 2015 he was seen by my nurse practitioner and had ordered an ultrasound because of left lower quadrant discomfort. It was noted at that time that her endometrial stripe was 6.5 mm and she had an endometrial biopsy which was normal. Patient became frightened because of endometrial thickness and she stated that she did not resume her tamoxifen.  Patient has been tested in the past for the BRCA1 and BRCA2 gene mutation and was not present. She had a prophylactic laparoscopic BSO last year with pathology report being benign. She is currently taking her calcium and vitamin D. patientdoes her monthly self breast examination. Patient is scheduled for mammogram this week. She is scheduled to see the oncologist at the end of this month as well. Her bone density of 2014 had demonstrated her lowest T score was -2.7 at the AP spine. Patient with no prior history of abnormal Pap smears. Patient denied any vaginal bleeding.   Past medical history,surgical history, family history and social history were all reviewed and documented in the EPIC chart.  Gynecologic History No LMP recorded. Patient is not currently having periods (Reason: Other). Contraception: Bilateral salpingo-oophorectomy Last Pap: 2014. Results were: normal Last mammogram: 2014. Results were: normal  Obstetric History OB History  Gravida Para Term Preterm AB SAB TAB Ectopic Multiple Living  _0 # Outcome Date GA Lbr Len/2nd Weight Sex Delivery Anes PTL Lv  3 SAB           2 TRM     M SVD  N Y  1 TRM     F SVD  N Y       ROS: A ROS was performed and pertinent  positives and negatives are included in the history.  GENERAL: No fevers or chills. HEENT: No change in vision, no earache, sore throat or sinus congestion. NECK: No pain or stiffness. CARDIOVASCULAR: No chest pain or pressure. No palpitations. PULMONARY: No shortness of breath, cough or wheeze. GASTROINTESTINAL: No abdominal pain, nausea, vomiting or diarrhea, melena or bright red blood per rectum. GENITOURINARY: No urinary frequency, urgency, hesitancy or dysuria. MUSCULOSKELETAL: No joint or muscle pain, no back pain, no recent trauma. DERMATOLOGIC: No rash, no itching, no lesions. ENDOCRINE: No polyuria, polydipsia, no heat or cold intolerance. No recent change in weight. HEMATOLOGICAL: No anemia or easy bruising or bleeding. NEUROLOGIC: No headache, seizures, numbness, tingling or weakness. PSYCHIATRIC: No depression, no loss of interest in normal activity or change in sleep pattern.     Exam: chaperone present  BP 126/80  Ht 5' 7.75" (1.721 m)  Wt 138 lb (62.596 kg)  BMI 21.13 kg/m2  Body mass index is 21.13 kg/(m^2).  General appearance : Well developed well nourished female. No acute distress HEENT: Neck supple, trachea midline, no carotid bruits, no thyroidmegaly Lungs: Clear to auscultation, no rhonchi or wheezes, or rib retractions  Heart: Regular rate and rhythm, no murmurs or gallops Breast:Examined in sitting and supine position were symmetrical in appearance, no palpable masses or tenderness,  no skin retraction, no nipple inversion, no nipple discharge, no  skin discoloration, no axillary or supraclavicular lymphadenopathy Abdomen: no palpable masses or tenderness, no rebound or guarding Extremities: no edema or skin discoloration or tenderness  Pelvic:  Bartholin, Urethra, Skene Glands: Within normal limits             Vagina: No gross lesions or discharge  Cervix: No gross lesions or discharge  Uterus  anteverted, normal size, shape and consistency, non-tender and  mobile  Adnexa  Without masses or tenderness  Anus and perineum  normal   Rectovaginal  normal sphincter tone without palpated masses or tenderness             Hemoccult not indicated     Assessment/Plan:  48 y.o. female for annual exam who is doing well. We will do a bone density study next year. We discussed importance of calcium and vitamin D and regular exercise. She was instructed to restart her tamoxifen. She will follow up with her oncologist at the end of the month. She is being followed by the orthopedic surgeon because of hip pain the patient has been experiencing and has an MRI scheduled in the next few days and will discuss further with him. Blood work will be drawn by her PCP. Patient not certain if she has had her Tdap vaccine but will check with her immunization records. Pap smear not done today in accordance to the new guidelines.  Note: This dictation was prepared with  Dragon/digital dictation along withSmart phrase technology. Any transcriptional errors that result from this process are unintentional.   Terrance Mass MD, 4:26 PM 06/17/2013

## 2013-06-19 ENCOUNTER — Encounter: Payer: Self-pay | Admitting: Gynecology

## 2013-06-22 ENCOUNTER — Telehealth: Payer: Self-pay

## 2013-06-22 NOTE — Telephone Encounter (Signed)
Order sent to [facility] for diagnostic mammogram 06/22/13.  Sent to scan.

## 2013-06-27 ENCOUNTER — Other Ambulatory Visit: Payer: Self-pay | Admitting: Physician Assistant

## 2013-07-01 ENCOUNTER — Other Ambulatory Visit: Payer: BC Managed Care – PPO

## 2013-07-01 DIAGNOSIS — C50411 Malignant neoplasm of upper-outer quadrant of right female breast: Secondary | ICD-10-CM

## 2013-07-01 DIAGNOSIS — M810 Age-related osteoporosis without current pathological fracture: Secondary | ICD-10-CM

## 2013-07-01 LAB — COMPREHENSIVE METABOLIC PANEL (CC13)
ALBUMIN: 4 g/dL (ref 3.5–5.0)
ALT: 12 U/L (ref 0–55)
AST: 19 U/L (ref 5–34)
Alkaline Phosphatase: 56 U/L (ref 40–150)
Anion Gap: 7 mEq/L (ref 3–11)
BUN: 16.1 mg/dL (ref 7.0–26.0)
CALCIUM: 9.4 mg/dL (ref 8.4–10.4)
CHLORIDE: 105 meq/L (ref 98–109)
CO2: 30 mEq/L — ABNORMAL HIGH (ref 22–29)
Creatinine: 0.8 mg/dL (ref 0.6–1.1)
Glucose: 73 mg/dl (ref 70–140)
POTASSIUM: 4 meq/L (ref 3.5–5.1)
SODIUM: 142 meq/L (ref 136–145)
Total Bilirubin: 0.88 mg/dL (ref 0.20–1.20)
Total Protein: 6.9 g/dL (ref 6.4–8.3)

## 2013-07-01 LAB — CBC WITH DIFFERENTIAL/PLATELET
BASO%: 1.3 % (ref 0.0–2.0)
Basophils Absolute: 0.1 10*3/uL (ref 0.0–0.1)
EOS%: 3.2 % (ref 0.0–7.0)
Eosinophils Absolute: 0.1 10*3/uL (ref 0.0–0.5)
HCT: 40.3 % (ref 34.8–46.6)
HGB: 13.1 g/dL (ref 11.6–15.9)
LYMPH#: 1 10*3/uL (ref 0.9–3.3)
LYMPH%: 23.6 % (ref 14.0–49.7)
MCH: 28.6 pg (ref 25.1–34.0)
MCHC: 32.4 g/dL (ref 31.5–36.0)
MCV: 88.1 fL (ref 79.5–101.0)
MONO#: 0.5 10*3/uL (ref 0.1–0.9)
MONO%: 11.9 % (ref 0.0–14.0)
NEUT#: 2.5 10*3/uL (ref 1.5–6.5)
NEUT%: 60 % (ref 38.4–76.8)
Platelets: 244 10*3/uL (ref 145–400)
RBC: 4.58 10*6/uL (ref 3.70–5.45)
RDW: 12.9 % (ref 11.2–14.5)
WBC: 4.2 10*3/uL (ref 3.9–10.3)

## 2013-07-02 ENCOUNTER — Telehealth: Payer: Self-pay | Admitting: *Deleted

## 2013-07-02 LAB — VITAMIN D 25 HYDROXY (VIT D DEFICIENCY, FRACTURES): VIT D 25 HYDROXY: 36 ng/mL (ref 30–89)

## 2013-07-02 NOTE — Telephone Encounter (Signed)
Spoke with patient and rescheduled and confirmed her appointment to 07/16/13 at 4pm with Dr. Lona Kettle.

## 2013-07-08 ENCOUNTER — Ambulatory Visit: Payer: 59 | Admitting: Physician Assistant

## 2013-07-09 ENCOUNTER — Telehealth: Payer: Self-pay | Admitting: *Deleted

## 2013-07-09 NOTE — Telephone Encounter (Signed)
Received a call from patient stating she would like to reschedule her appointment with a APP instead of MD because she states it is cheaper to see NP or PA.  Confirmed new appointment with Mikey Bussing for 07/22/13 at 1145.

## 2013-07-10 ENCOUNTER — Telehealth: Payer: Self-pay | Admitting: Hematology

## 2013-07-10 NOTE — Telephone Encounter (Signed)
cld & spoke to pt & adv pt to be here @1 :15 for 1:30 appt-pt understood-chgd pt from [facility] to Cover 2

## 2013-07-16 ENCOUNTER — Ambulatory Visit: Payer: BC Managed Care – PPO

## 2013-07-22 ENCOUNTER — Ambulatory Visit: Payer: BC Managed Care – PPO | Admitting: Oncology

## 2013-07-22 ENCOUNTER — Ambulatory Visit (HOSPITAL_BASED_OUTPATIENT_CLINIC_OR_DEPARTMENT_OTHER): Payer: BC Managed Care – PPO | Admitting: Hematology

## 2013-07-22 ENCOUNTER — Encounter: Payer: Self-pay | Admitting: Hematology

## 2013-07-22 VITALS — BP 97/65 | HR 89 | Temp 98.1°F | Resp 18 | Ht 67.75 in | Wt 131.9 lb

## 2013-07-22 DIAGNOSIS — Z853 Personal history of malignant neoplasm of breast: Secondary | ICD-10-CM

## 2013-07-22 DIAGNOSIS — M810 Age-related osteoporosis without current pathological fracture: Secondary | ICD-10-CM

## 2013-07-22 DIAGNOSIS — C50919 Malignant neoplasm of unspecified site of unspecified female breast: Secondary | ICD-10-CM

## 2013-07-22 NOTE — Progress Notes (Signed)
. [facility]  Telephone:([phone] Fax:([phone]) [phone]  OFFICE PROGRESS NOTE   ID: Nancy [facility]ard   DOB: 1965-02-10  MR#: [id number]  Nancy [facility]#:[id number]   PCP: Nancy Mass, MD GYN: Nancy Rising, MD SU: [facility]. Nancy Nakayama, MD RAD ONC:  Nancy Pray, MD  Reason for office visit: Breast Cancer follow up.  PATIENT IDENTIFICATION:  This is a 48 year old woman with hx of breast cancer. This woman has been in excellent health.  She has no underlying medical problems.  A screening mammogram; this was performed at [facility] on 06/30/2008.  This showed a 1.1 cm Pollard posterior third right breast.  Additional views were recommended.  Additional views on 07/08/2008 showed a hypoechoic microlobulated Pollard, maximum diameter parallel to chest wall measuring 1.6 cm.  Biopsy was recommended.  Biopsy on 07/09/2008 showed this to be an ER positive at 6%, PR positive at 44% ductal cancer.  A preoperative MRI scan was performed on 07/23/2008.  This shows a 1.6 x 1.3 x 1.2 cm Pollard 6 o'clock position right breast.  The patient underwent a lumpectomy and sentinel lymph node evaluation 09/02/2008.  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  This was 0.1 cm from the inferior margin.  Lymphovascular invasion was noted.  Five sentinel lymph nodes were identified all of which were negative for malignancy. Oncotype DX score of 34 predicted a distant recurrence at 23% within 10 years if the patient's only systemic treatment is tamoxifen for 5 years. The patient received adjuvant chemotherapy with docetaxel and cyclophosphamide x4 cycles from 10/17/2008 through 12/18/2008.  Status post radiation therapy from 01/13/2009 through 03/02/2009. The patient took Tamoxifen 03/2009 to [facility] 2012.  At that time her antiestrogen therapy was changed to Anastrozole , continued until May 2014, when it was discontinued due to poor tolerance. The plan is to restart tamoxifen, although the patient had not yet  restarted that medication as of December 2014. She is s/p post bilateral salpingo-oophorectomy in July 2012  INTERVAL HISTORY:  [facility] returns  for followup. She is currently not taking tamoxifen or vitamin D. I had a long discussion with her. She is almost approaching 5 years from her diagnosis. She has not relapsed in the first few years since her diagnosis. However there are cases of delayed recurrence in estrogen positive breast tumors. Also based on the new studies 10 years of tamoxifen provides ongoing protection for breast cancer recurrence there is definitely some advantage in continuation of tamoxifen or one of the aromatase inhibitors. Her mammogram performed on 06/19/2013 was BI-RADS category 2 benign mammogram. Patient had abnormal ultrasound of the uterus performed on 02/08/2013 showing retroverted uterus, endometrium was slightly increased with a small cystic area and adnexa are normal there is no free fluid or Pollard seen. Patient did have an endometrial biopsy performed on 02/08/2013 and pathology showed benign endometrial glands and stroma. It was negative for hyperplasia or malignancy. There was no intraepithelial lesion or malignancy.  REVIEW OF SYSTEMS:  Otherwise, Nancy Pollard has had no recent illnesses and denies any fevers or chills. She's had no problems with hot flashes. She denies any abnormal bruising or bleeding, and specifically denies any abnormal vaginal bleeding. Her energy level is good. She's had no problems with nausea, emesis, or change in bowel or bladder habits. She denies any cough, shortness of breath, chest pain, palpitations. She's had no abnormal headaches, dizziness, or change in vision. She has some occasional postsurgical pain in the right breast  which is stable.  Other than the groin pain noted above, she has had no additional myalgias, arthralgias, or bony pain. She denies any back pain. She's had no peripheral swelling.  A detailed review of systems is otherwise stable  and noncontributory.   PAST MEDICAL HISTORY: Past Medical History  Diagnosis Date  . BRCA1 negative   . BRCA2 negative   . Vaginal delivery     2 NSVD'S  . Missed ab     ONE  . Osteopenia   . S/P oophorectomy   . Breast cancer 2010    RIGHT BREAST CA/ POSITIVE EST/PROG RECEPTOR    PAST SURGICAL HISTORY: Past Surgical History  Procedure Laterality Date  . Dilation and curettage of uterus      MISSED AB  . Lapr  bso  7.19.12  . Vagina surgery      stretch  . Breast lumpectomy  09/02/2008    Right lumpectomy+sln,PR+,Her2-,T1cN0    FAMILY HISTORY Family History  Problem Relation Age of Onset  . Thyroid disease Mother   . Cancer Mother 101    br cancer   . Breast cancer Mother   . Cancer Maternal Uncle     PANCREATIC CANCER  . Cancer Paternal Uncle     LUNG- grandmothers brother  . Heart disease Maternal Grandmother   . Cancer Maternal Grandmother     UTERINE  . Heart disease Maternal Grandfather   . Hypertension Maternal Grandfather   . Heart disease Paternal Grandmother   . Heart disease Paternal Grandfather   . Hypertension Paternal Grandfather     GYNECOLOGIC HISTORY:  (Updated 12/24/2012) G2, P2, menarche age 52, premenopausal with regular periods at time of diagnosis. Status post bilateral salpingo-oophorectomy in July 2012.  She has had genetic testing, which is negative for the BRCA1 and 2 gene. [place] results and endometrial biopsy results are mentioned from Jan 2015 negative for malignancy.  SOCIAL HISTORY:  (Updated 12/24/2012) Her husband, Nancy [facility]ard, is a retired Community education officer. She works at [employer] in [place], and also runs a [employer].  They have lived in [place], [place] for over 10 years.  She has two children, a daughter 39 and a son 60. She originally grew up in [place], [place]. She is a Investment banker, [employer].  ADVANCED DIRECTIVES:  Not on file  HEALTH MAINTENANCE: History  Substance Use Topics  . Smoking  status: Never Smoker   . Smokeless tobacco: Never Used  . Alcohol Use: Yes     Comment: RARELY    Colonoscopy: N/A PAP: June 2014/[facility] Bone density: 06/21/2012, osteoporosis with a T score of -2.7 Lipid panel:  Not on file    No Known Allergies  Current Outpatient Prescriptions  Medication Sig Dispense Refill  . Nutritional Supplements (JUICE PLUS FIBRE) LIQD Take by mouth.      . Cholecalciferol (VITAMIN D PO) Take 1,000 Int'l Units by mouth daily.       . tamoxifen (NOLVADEX) 20 MG tablet Take 1 tablet (20 mg total) by mouth daily.  90 tablet  3   No current facility-administered medications for this visit.    OBJECTIVE: Middle-aged white woman who looks well and is in no acute distress Filed Vitals:   07/22/13 1320  BP: 97/65  Pulse: 89  Temp: 98.1 F (36.7 C)  Resp: 18     Body Pollard index is 20.2 kg/(m^2).     ECOG FS:  0 Filed Weights   07/22/13 1320  Weight:  131 lb 14.4 oz (59.829 kg)   Physical Exam: HEENT:  Sclerae anicteric.  Oropharynx clear and moist.  Neck supple. NODES:  No cervical or supraclavicular lymphadenopathy palpated. No inguinal lymphadenopathy palpated. BREAST EXAM:  Right breast is status post lumpectomy with no suspicious nodularities or skin changes. No evidence of local recurrence. Left breast is unremarkable. Axillae are benign bilaterally, with no palpable lymphadenopathy. LUNGS:  Clear to auscultation bilaterally with good excursion..  No wheezes or rhonchi HEART:  Regular rate and rhythm. No murmur  ABDOMEN:  Soft, nontender.  Positive bowel sounds.  MSK:  No focal spinal tenderness to palpation. There is no significant pain to palpation in the left groin. No notable swelling. Good range of motion bilaterally in the lower extremities. Able to straight leg raise bilaterally with no pain or difficulty. EXTREMITIES:  No peripheral edema.   SKIN:  Benign, with no visible rashes or skin changes and no petechiae or ecchymoses. NEURO:   Nonfocal. Well oriented.  Appropriate affect.     LAB RESULTS: Lab Results  Component Value Date   WBC 4.2 07/01/2013   NEUTROABS 2.5 07/01/2013   HGB 13.1 07/01/2013   HCT 40.3 07/01/2013   MCV 88.1 07/01/2013   PLT 244 07/01/2013      Chemistry      Component Value Date/Time   NA 142 07/01/2013 0805   NA 140 03/25/2011 0839   K 4.0 07/01/2013 0805   K 3.9 03/25/2011 0839   CL 105 10/10/2011 0921   CL 104 03/25/2011 0839   CO2 30* 07/01/2013 0805   CO2 25 03/25/2011 0839   BUN 16.1 07/01/2013 0805   BUN 13 03/25/2011 0839   CREATININE 0.8 07/01/2013 0805   CREATININE 0.88 03/25/2011 0839      Component Value Date/Time   CALCIUM 9.4 07/01/2013 0805   CALCIUM 9.2 06/08/2012 1027   ALKPHOS 56 07/01/2013 0805   ALKPHOS 71 03/25/2011 0839   AST 19 07/01/2013 0805   AST 21 03/25/2011 0839   ALT 12 07/01/2013 0805   ALT 12 03/25/2011 0839   BILITOT 0.88 07/01/2013 0805   BILITOT 0.9 03/25/2011 0839       STUDIES: Bone density 06/21/2012 at [facility] showed osteoporosis, with a T score at the spine of -2.7, and at the left femoral neck of -1.7.   Bilateral diagnostic mammogram at [facility] on 06/19/2013 was unremarkable.   ASSESSMENT: 48 y.o. BRCA negative [place], [place] woman:  1.  Status post right lumpectomy with sentinel node biopsy on 09/02/2008 for a pT1c pN0, stage IA invasive ductal carcinoma, grade 2, ER 6%, PR 44%, Ki-67 16%, HYW-7/PXT no implication by CISH with a ratio 0.83  2. Oncotype DX score of 34 predicted a distant recurrence at 23% within 10 years if the patient's only systemic treatment is tamoxifen for 5 years.  3.  The patient received adjuvant chemotherapy with docetaxel and cyclophosphamide x4 cycles from 10/17/2008 through 12/18/2008.    4.  Status post radiation therapy from 01/13/2009 through 03/02/2009.  5.  The patient took Tamoxifen 03/2009 to [place] 2012.  At that time her antiestrogen therapy was changed to Anastrozole , continued until May  2014, when it was discontinued due to poor tolerance. Patient restarted tamoxifen but was having gynecological issues. She had an abnormal ultrasound leading to an endometrial biopsy which was negative for malignancy she has not resumed her tamoxifen since January of 2015.  6. status post bilateral salpingo-oophorectomy in July 2012  PLAN: With regards to her breast cancer, [facility] appears to be doing well with no clinical evidence of disease recurrence.   I had a long discussion with patient. I went over her original pathology with assured that she is a 1.6 cm tumor grade 2-3 with lymphovascular invasion all high risk features. The fact that she is 5 years out of her surgery is reassuring. The fact that she underwent bilateral salpingo-oophorectomy is also good and offers some protection in terms of the recurrence of breast cancer. It also eliminate the risk for ovarian cancer.  Ideally she should stay on either tamoxifen or one of the aromatase inhibitor drugs such as Arimidex, Femara, or Aromasin.  Patient took the written material with her and will inform us about her decision. If she wants to start tamoxifen or an AI, we will see her back in 6 months. If she decides to stay off anti-estrogen therapy, we will see her back in one year  In any event, we will continue the breast exams and mammographic surveillance. Her mammogram will be due in June of 2016  All her questions were answered and time spent with her was 45 minutes.  Bernadene Bell, MD Medical Hematologist/Oncologist [place] Pager: [phone] Office No: [phone]     07/22/2013, 6:40 PM

## 2013-07-26 ENCOUNTER — Telehealth: Payer: Self-pay | Admitting: Oncology

## 2013-07-26 NOTE — Telephone Encounter (Signed)
lmonvm for pt re appts for July 2016. Initial message incomplete - left a 2nd complete message re appts and mailed scheduled. Called [facility] re annual mammo for June 2016. Per D[facility] schedule not open for June 2016. Pt informed in vm to contact [facility] next year to request her annual mammo for June 2016. A note re calling for also included in mailing.

## 2013-07-31 ENCOUNTER — Other Ambulatory Visit: Payer: Self-pay | Admitting: Oncology

## 2013-11-11 ENCOUNTER — Encounter: Payer: Self-pay | Admitting: Hematology

## 2014-01-16 ENCOUNTER — Other Ambulatory Visit: Payer: BC Managed Care – PPO

## 2014-01-23 ENCOUNTER — Other Ambulatory Visit: Payer: BC Managed Care – PPO

## 2014-01-23 ENCOUNTER — Ambulatory Visit: Payer: BC Managed Care – PPO | Admitting: Oncology

## 2014-06-02 ENCOUNTER — Telehealth: Payer: Self-pay | Admitting: Oncology

## 2014-06-02 NOTE — Telephone Encounter (Signed)
Left message to confirm July 14 moved to earlier due to call day. Mailed calendar.

## 2014-06-19 ENCOUNTER — Other Ambulatory Visit (HOSPITAL_COMMUNITY)
Admission: RE | Admit: 2014-06-19 | Discharge: 2014-06-19 | Disposition: A | Discharge status: Home / Self Care | Payer: BLUE CROSS/BLUE SHIELD | Source: Ambulatory Visit | Attending: Gynecology | Admitting: Gynecology

## 2014-06-19 ENCOUNTER — Encounter: Payer: Self-pay | Admitting: Gynecology

## 2014-06-19 ENCOUNTER — Ambulatory Visit (INDEPENDENT_AMBULATORY_CARE_PROVIDER_SITE_OTHER): Payer: BLUE CROSS/BLUE SHIELD | Admitting: Gynecology

## 2014-06-19 VITALS — BP 102/62 | Ht 68.0 in | Wt 135.8 lb

## 2014-06-19 DIAGNOSIS — Z01419 Encounter for gynecological examination (general) (routine) without abnormal findings: Secondary | ICD-10-CM | POA: Insufficient documentation

## 2014-06-19 DIAGNOSIS — Z853 Personal history of malignant neoplasm of breast: Secondary | ICD-10-CM

## 2014-06-19 DIAGNOSIS — M810 Age-related osteoporosis without current pathological fracture: Secondary | ICD-10-CM

## 2014-06-19 DIAGNOSIS — Z124 Encounter for screening for malignant neoplasm of cervix: Secondary | ICD-10-CM

## 2014-06-19 NOTE — Addendum Note (Signed)
Addended by: Alen Blew on: 06/19/2014 03:52 PM   Modules accepted: Orders

## 2014-06-19 NOTE — Patient Instructions (Addendum)
Osteoporosis Throughout your life, your body breaks down old bone and replaces it with new bone. As you get older, your body does not replace bone as quickly as it breaks it down. By the age of 30 years, most people begin to gradually lose bone because of the imbalance between bone loss and replacement. Some people lose more bone than others. Bone loss beyond a specified normal degree is considered osteoporosis.  Osteoporosis affects the strength and durability of your bones. The inside of the ends of your bones and your flat bones, like the bones of your pelvis, look like honeycomb, filled with tiny open spaces. As bone loss occurs, your bones become less dense. This means that the open spaces inside your bones become bigger and the walls between these spaces become thinner. This makes your bones weaker. Bones of a person with osteoporosis can become so weak that they can break (fracture) during minor accidents, such as a simple fall. CAUSES  The following factors have been associated with the development of osteoporosis:  Smoking.  Drinking more than 2 alcoholic drinks several days per week.  Long-term use of certain medicines:  Corticosteroids.  Chemotherapy medicines.  Thyroid medicines.  Antiepileptic medicines.  Gonadal hormone suppression medicine.  Immunosuppression medicine.  Being underweight.  Lack of physical activity.  Lack of exposure to the sun. This can lead to vitamin D deficiency.  Certain medical conditions:  Certain inflammatory bowel diseases, such as Crohn disease and ulcerative colitis.  Diabetes.  Hyperthyroidism.  Hyperparathyroidism. RISK FACTORS Anyone can develop osteoporosis. However, the following factors can increase your risk of developing osteoporosis:  Gender--Women are at higher risk than men.  Age--Being older than 50 years increases your risk.  Ethnicity--White and Asian people have an increased risk.  Weight --Being extremely  underweight can increase your risk of osteoporosis.  Family history of osteoporosis--Having a family member who has developed osteoporosis can increase your risk. SYMPTOMS  Usually, people with osteoporosis have no symptoms.  DIAGNOSIS  Signs during a physical exam that may prompt your caregiver to suspect osteoporosis include:  Decreased height. This is usually caused by the compression of the bones that form your spine (vertebrae) because they have weakened and become fractured.  A curving or rounding of the upper back (kyphosis). To confirm signs of osteoporosis, your caregiver may request a procedure that uses 2 low-dose X-ray beams with different levels of energy to measure your bone mineral density (dual-energy X-ray absorptiometry [DXA]). Also, your caregiver may check your level of vitamin D. TREATMENT  The goal of osteoporosis treatment is to strengthen bones in order to decrease the risk of bone fractures. There are different types of medicines available to help achieve this goal. Some of these medicines work by slowing the processes of bone loss. Some medicines work by increasing bone density. Treatment also involves making sure that your levels of calcium and vitamin D are adequate. PREVENTION  There are things you can do to help prevent osteoporosis. Adequate intake of calcium and vitamin D can help you achieve optimal bone mineral density. Regular exercise can also help, especially resistance and weight-bearing activities. If you smoke, quitting smoking is an important part of osteoporosis prevention. MAKE SURE YOU:  Understand these instructions.  Will watch your condition.  Will get help right away if you are not doing well or get worse. FOR MORE INFORMATION www.osteo.org and www.nof.org Document Released: 10/06/2004 Document Revised: 04/23/2012 Document Reviewed: 12/11/2010 [employer] Patient Information 2015 [employer]. This information is not   intended to replace advice  given to you by your health care provider. Make sure you discuss any questions you have with your health care provider. Raloxifene tablets What is this medicine? RALOXIFENE (ral OX i feen) reduces the amount of calcium lost from bones. It is used to treat and prevent osteoporosis in women who have experienced menopause. This medicine may be used for other purposes; ask your health care provider or pharmacist if you have questions. COMMON BRAND NAME(S): Evista What should I tell my health care provider before I take this medicine? They need to know if you have any of these conditions: -a history of blood clots -cancer -heart failure -liver disease -premenopausal -an unusual or allergic reaction to raloxifene, other medicines, foods, dyes, or preservatives -pregnant or trying to get pregnant -breast-feeding How should I use this medicine? Take this medicine by mouth with a glass of water. Follow the directions on the prescription label. The tablets can be taken with or without food. Take your doses at regular intervals. Do not take your medicine more often than directed. Talk to your pediatrician regarding the use of this medicine in children. Special care may be needed. Overdosage: If you think you have taken too much of this medicine contact a poison control center or emergency room at once. NOTE: This medicine is only for you. Do not share this medicine with others. What if I miss a dose? If you miss a dose, take it as soon as you can. If it is almost time for your next dose, take only that dose. Do not take double or extra doses. What may interact with this medicine? -ampicillin -cholestyramine -colestipol -diazepam -diazoxide -female hormones like hormone replacement therapy -lidocaine -warfarin This list may not describe all possible interactions. Give your health care provider a list of all the medicines, herbs, non-prescription drugs, or dietary supplements you use. Also tell  them if you smoke, drink alcohol, or use illegal drugs. Some items may interact with your medicine. What should I watch for while using this medicine? Visit your doctor or health care professional for regular checks on your progress. Do not stop taking this medicine except on the advice of your doctor or health care professional. You should make sure you get enough calcium and vitamin D in your diet while you are taking this medicine. Discuss your dietary needs with your health care professional or nutritionist. Exercise may help to prevent bone loss. Discuss your exercise needs with your doctor or health care professional. This medicine can rarely cause blood clots. You should avoid long periods of bed rest while taking this medicine. If you are going to have surgery, tell your doctor or health care professional that you are taking this medicine. This medicine should be stopped at least 3 days before surgery. After surgery, it should be restarted only after you are walking again. It should not be restarted while you still need long periods of bed rest. You should not smoke while taking this medicine. Smoking may also increase your risk of blood clots. Smoking can also decrease the effects of this medicine. This medicine does not prevent hot flashes. It may cause hot flashes in some patients at the start of therapy. What side effects may I notice from receiving this medicine? Side effects that you should report to your doctor or health care professional as soon as possible: -change in vision -chest pain -difficulty breathing -leg pain or swelling -skin rash, itching Side effects that usually do not require medical  attention (report to your doctor or health care professional if they continue or are bothersome): -fluid build-up -leg cramps -stomach pain -sweating This list may not describe all possible side effects. Call your doctor for medical advice about side effects. You may report side effects  to FDA at 1-800-FDA-1088. Where should I keep my medicine? Keep out of the reach of children. Store at room temperature between 15 and 30 degrees C (59 and 86 degrees F). Throw away any unused medicine after the expiration date. NOTE: This sheet is a summary. It may not cover all possible information. If you have questions about this medicine, talk to your doctor, pharmacist, or health care provider.  2015, Elsevier/Gold Standard. (2007-12-13 15:15:14) Teriparatide injection What is this medicine? TERIPARATIDE (terr ih PAR a tyd) increases bone mass and strength. It helps make healthy bone and to slow bone loss. This medicine is used to prevent bone fractures. This medicine may be used for other purposes; ask your health care provider or pharmacist if you have questions. COMMON BRAND NAME(S): Forteo What should I tell my health care provider before I take this medicine? They need to know if you have any of these conditions: -bone disease other than osteoporosis -heart, kidney or liver disease -history of cancer in the bone -kidney stone -Paget's disease -parathyroid disease -receiving radiation therapy -an unusual or allergic reaction to teriparatide, other medicines, foods, dyes, or preservatives -pregnant or trying to get pregnant -breast-feeding How should I use this medicine? This medicine comes in an injection pen device. This pen injects the medicine under your skin. Follow the directions on the prescription label. You will be taught how to use this medicine. Take your medicine at regular intervals. Do not take your medicine more often than directed. Do not use this medicine if it has solid particles in it, or if it is cloudy or colored. It should be clear and colorless. Be sure that you are using your pen device correctly. A special MedGuide will be given to you by the pharmacist with each prescription and refill. Be sure to read this information carefully each time. Talk to your  pediatrician regarding the use of this medicine in children. Special care may be needed. Overdosage: If you think you have taken too much of this medicine contact a poison control center or emergency room at once. NOTE: This medicine is only for you. Do not share this medicine with others. What if I miss a dose? If you miss a dose, take it as soon as you can. If it is almost time for your next dose, take only that dose. Do not take double or extra doses. What may interact with this medicine? -digoxin -other medicines to strengthen bone This list may not describe all possible interactions. Give your health care provider a list of all the medicines, herbs, non-prescription drugs, or dietary supplements you use. Also tell them if you smoke, drink alcohol, or use illegal drugs. Some items may interact with your medicine. What should I watch for while using this medicine? Visit your doctor or health care professional for regular checks on your progress. Your doctor may order blood tests and other tests to see how you are doing. You should make sure you get enough calcium and vitamin D while you are taking this medicine, unless your doctor tells you not to. Discuss the foods you eat and the vitamins you take with your health care professional. Dennis Bast may get drowsy or dizzy. Do not drive, use machinery, or  do anything that needs mental alertness until you know how this medicine affects you. Do not stand or sit up quickly, especially if you are an older patient. This reduces the risk of dizzy or fainting spells. What side effects may I notice from receiving this medicine? Side effects that you should report to your doctor or health care professional as soon as possible: -allergic reactions like skin rash, itching or hives, swelling of the face, lips, or tongue -chronic constipation -high blood pressure -irregular heartbeat, chest pain -nausea, vomiting -unusually weak or tired Side effects that usually do  not require medical attention (report to your doctor or health care professional if they continue or are bothersome): -bone pain -cough, runny nose -headache -leg cramps -muscle spasms in the back or legs -pain, redness, irritation or swelling at the injection site -stomach upset -trouble sleeping This list may not describe all possible side effects. Call your doctor for medical advice about side effects. You may report side effects to FDA at 1-800-FDA-1088. Where should I keep my medicine? Keep out of the reach of children. Store in a refrigerator between 2 and 8 degrees C (36 and 46 degrees F). Do not freeze. Recap the pen injector when not in use to protect it from light and damage. Use quickly after taking out of the refrigerator and return to refrigerator right after using. Throw away any unused medicine 28 days after the first injection from the device. Do not use after the expiration date printed on the pen and pen packaging. NOTE: This sheet is a summary. It may not cover all possible information. If you have questions about this medicine, talk to your doctor, pharmacist, or health care provider.  2015, Elsevier/Gold Standard. (2007-12-25 17:45:37)

## 2014-06-19 NOTE — Progress Notes (Signed)
Nancy Pollard 10-05-1965 [id number]   History:    49 y.o.  for annual gyn exam who is asymptomatic and has a past history of right lumpectomy for breast cancer (positive estrogen and progesterone receptors) who had completed treatment with chemotherapy and radiation in the past and originally was started on tamoxifen in February 2011. The patient had been seen in the office in January 2015 he was seen by my nurse practitioner and had ordered an ultrasound because of left lower quadrant discomfort. It was noted at that time that her endometrial stripe was 6.5 mm and she had an endometrial biopsy which was normal. Patient became frightened because of endometrial thickness and she stated that she did not resume her tamoxifen.  Patient has been tested in the past for the BRCA1 and BRCA2 gene mutation and was not present. She had a prophylactic laparoscopic BSO last year with pathology report being benign. She is currently taking her calcium and vitamin D. patientdoes her monthly self breast examination. Patient is scheduled for mammogram this week. She is scheduled to see the oncologist at the end of this month as well. Her bone density of 2014 had demonstrated her lowest T score was -2.7 at the [facility] but patient has refused treatment for now. Patient with no previous history of abnormal Pap smear. Patient denies any vaginal bleeding. Patient has not had her colonoscopy yet.  Past medical history,surgical history, family history and social history were all reviewed and documented in the EPIC chart.  Gynecologic History No LMP recorded. Patient is not currently having periods (Reason: Other). Contraception: Bilateral salpingo-oophorectomy Last Pap: 2012. Results were: normal Last mammogram: 2015. Results were: normal  Obstetric History OB History  Gravida Para Term Preterm AB SAB TAB Ectopic Multiple Living  _0 # Outcome Date GA Lbr Len/2nd Weight Sex Delivery Anes PTL Lv  3 SAB            2 Term     M Vag-Spont  N Y  1 Term     F Vag-Spont  N Y       ROS: A ROS was performed and pertinent positives and negatives are included in the history.  GENERAL: No fevers or chills. HEENT: No change in vision, no earache, sore throat or sinus congestion. NECK: No pain or stiffness. CARDIOVASCULAR: No chest pain or pressure. No palpitations. PULMONARY: No shortness of breath, cough or wheeze. GASTROINTESTINAL: No abdominal pain, nausea, vomiting or diarrhea, melena or bright red blood per rectum. GENITOURINARY: No urinary frequency, urgency, hesitancy or dysuria. MUSCULOSKELETAL: No joint or muscle pain, no back pain, no recent trauma. DERMATOLOGIC: No rash, no itching, no lesions. ENDOCRINE: No polyuria, polydipsia, no heat or cold intolerance. No recent change in weight. HEMATOLOGICAL: No anemia or easy bruising or bleeding. NEUROLOGIC: No headache, seizures, numbness, tingling or weakness. PSYCHIATRIC: No depression, no loss of interest in normal activity or change in sleep pattern.     Exam: chaperone present  BP 102/62 mmHg  Ht _1  (1.727 m)  Wt 135 lb 12.8 oz (61.598 kg)  BMI 20.65 kg/m2  Body mass index is 20.65 kg/(m^2).  General appearance : Well developed well nourished female. No acute distress HEENT: Eyes: no retinal hemorrhage or exudates,  Neck supple, trachea midline, no carotid bruits, no thyroidmegaly Lungs: Clear to auscultation, no rhonchi or wheezes, or rib retractions  Heart: Regular rate and rhythm, no murmurs or  gallops Breast:Examined in sitting and supine position were symmetrical in appearance, no palpable masses or tenderness,  no skin retraction, no nipple inversion, no nipple discharge, no skin discoloration, no axillary or supraclavicular lymphadenopathy Abdomen: no palpable masses or tenderness, no rebound or guarding Extremities: no edema or skin discoloration or tenderness  Pelvic:  Bartholin, Urethra, Skene Glands: Within normal limits              Vagina: No gross lesions or discharge  Cervix: No gross lesions or discharge  Uterus  anteverted, normal size, shape and consistency, non-tender and mobile  Adnexa  Without masses or tenderness  Anus and perineum  normal   Rectovaginal  normal sphincter tone without palpated masses or tenderness             Hemoccult will scheduled for colonoscopy this year     Assessment/Plan:  49 y.o. female for annual exam patient with personal history of breast cancer. She is no longer on tamoxifen. She has history of osteoporosis but has been adamant of starting on any type of treatment. She was scheduled for bone density study here in the next few weeks. She has a follow-up appointment with her oncologist in the next few weeks. She may want to think about Evista or Forteo. Meanwhile she'll continue her calcium and vitamin D. Her oncologist will be doing her blood work in the next few weeks. Pap smear was done today. Patient to schedule her mammogram and colonoscopy respectively.   Terrance Mass MD, 2:29 PM 06/19/2014

## 2014-06-20 ENCOUNTER — Encounter: Payer: Self-pay | Admitting: Internal Medicine

## 2014-06-24 ENCOUNTER — Ambulatory Visit (INDEPENDENT_AMBULATORY_CARE_PROVIDER_SITE_OTHER): Payer: BLUE CROSS/BLUE SHIELD

## 2014-06-24 DIAGNOSIS — Z853 Personal history of malignant neoplasm of breast: Secondary | ICD-10-CM | POA: Diagnosis not present

## 2014-06-24 DIAGNOSIS — M810 Age-related osteoporosis without current pathological fracture: Secondary | ICD-10-CM | POA: Diagnosis not present

## 2014-06-24 LAB — CYTOLOGY - PAP

## 2014-06-26 ENCOUNTER — Encounter: Payer: Self-pay | Admitting: Gynecology

## 2014-07-04 ENCOUNTER — Encounter: Payer: Self-pay | Admitting: Genetic Counselor

## 2014-07-17 ENCOUNTER — Other Ambulatory Visit: Payer: Self-pay | Admitting: *Deleted

## 2014-07-17 ENCOUNTER — Other Ambulatory Visit (HOSPITAL_BASED_OUTPATIENT_CLINIC_OR_DEPARTMENT_OTHER): Payer: BLUE CROSS/BLUE SHIELD

## 2014-07-17 DIAGNOSIS — C50411 Malignant neoplasm of upper-outer quadrant of right female breast: Secondary | ICD-10-CM

## 2014-07-17 DIAGNOSIS — Z853 Personal history of malignant neoplasm of breast: Secondary | ICD-10-CM | POA: Diagnosis not present

## 2014-07-17 LAB — COMPREHENSIVE METABOLIC PANEL (CC13)
ALBUMIN: 4 g/dL (ref 3.5–5.0)
ALK PHOS: 59 U/L (ref 40–150)
ALT: 13 U/L (ref 0–55)
ANION GAP: 8 meq/L (ref 3–11)
AST: 20 U/L (ref 5–34)
BUN: 16.7 mg/dL (ref 7.0–26.0)
CO2: 27 mEq/L (ref 22–29)
Calcium: 9.4 mg/dL (ref 8.4–10.4)
Chloride: 105 mEq/L (ref 98–109)
Creatinine: 1 mg/dL (ref 0.6–1.1)
EGFR: 68 mL/min/{1.73_m2} — AB (ref >90)
Glucose: 91 mg/dl (ref 70–140)
POTASSIUM: 3.9 meq/L (ref 3.5–5.1)
Sodium: 140 mEq/L (ref 136–145)
TOTAL PROTEIN: 6.4 g/dL (ref 6.4–8.3)
Total Bilirubin: 0.94 mg/dL (ref 0.20–1.20)

## 2014-07-17 LAB — CBC WITH DIFFERENTIAL/PLATELET
BASO%: 1.2 % (ref 0.0–2.0)
Basophils Absolute: 0.1 10*3/uL (ref 0.0–0.1)
EOS%: 2.5 % (ref 0.0–7.0)
Eosinophils Absolute: 0.1 10*3/uL (ref 0.0–0.5)
HEMATOCRIT: 37.4 % (ref 34.8–46.6)
HGB: 12.3 g/dL (ref 11.6–15.9)
LYMPH#: 1.4 10*3/uL (ref 0.9–3.3)
LYMPH%: 26.8 % (ref 14.0–49.7)
MCH: 28.9 pg (ref 25.1–34.0)
MCHC: 32.9 g/dL (ref 31.5–36.0)
MCV: 87.8 fL (ref 79.5–101.0)
MONO#: 0.6 10*3/uL (ref 0.1–0.9)
MONO%: 12.4 % (ref 0.0–14.0)
NEUT#: 2.9 10*3/uL (ref 1.5–6.5)
NEUT%: 57.1 % (ref 38.4–76.8)
Platelets: 208 10*3/uL (ref 145–400)
RBC: 4.26 10*6/uL (ref 3.70–5.45)
RDW: 12.5 % (ref 11.2–14.5)
WBC: 5.2 10*3/uL (ref 3.9–10.3)

## 2014-07-24 ENCOUNTER — Ambulatory Visit (HOSPITAL_BASED_OUTPATIENT_CLINIC_OR_DEPARTMENT_OTHER): Payer: BLUE CROSS/BLUE SHIELD | Admitting: Oncology

## 2014-07-24 ENCOUNTER — Ambulatory Visit: Payer: BC Managed Care – PPO | Admitting: Oncology

## 2014-07-24 ENCOUNTER — Ambulatory Visit (AMBULATORY_SURGERY_CENTER): Payer: Self-pay

## 2014-07-24 VITALS — Ht 68.0 in | Wt 135.0 lb

## 2014-07-24 VITALS — BP 107/62 | HR 80 | Temp 98.1°F | Resp 18 | Ht 68.0 in | Wt 137.8 lb

## 2014-07-24 DIAGNOSIS — Z853 Personal history of malignant neoplasm of breast: Secondary | ICD-10-CM | POA: Diagnosis not present

## 2014-07-24 DIAGNOSIS — Z1211 Encounter for screening for malignant neoplasm of colon: Secondary | ICD-10-CM

## 2014-07-24 DIAGNOSIS — M810 Age-related osteoporosis without current pathological fracture: Secondary | ICD-10-CM | POA: Diagnosis not present

## 2014-07-24 DIAGNOSIS — C50411 Malignant neoplasm of upper-outer quadrant of right female breast: Secondary | ICD-10-CM

## 2014-07-24 MED ORDER — SUPREP BOWEL PREP KIT 17.5-3.13-1.6 GM/177ML PO SOLN: 1.0000 | Freq: Once | ORAL | Status: DC

## 2014-07-24 NOTE — Progress Notes (Signed)
No allergies to eggs or soy No diet/weight loss meds No home oxygen No past problem with anesthesia (light weight)  Has email  Emmi instructions given for colonoscopy

## 2014-07-24 NOTE — Progress Notes (Signed)
. Nancy Pollard  [phone] [phone]  OFFICE PROGRESS NOTE   ID: Nancy Pollard   DOB: Aug 02, 1965  MR#: [id number]  NID#:[id number]   PCP: Terrance Mass, MD GYN: Uvaldo Rising, MD SU: Sammuel Hines. Daiva Nakayama, MD RAD ONC:  Gery Pray, MD, Gaynelle Arabian MD  CHIEF COMPLAINT: Estrogen receptor positive breast cancer  CURRENT TREATMENT: Observation  BREAST CANCER HISTORY: From the earlier summary note:  This is a 49 year old woman with hx of breast cancer. This woman has been in excellent health.  She has no underlying medical problems.  A screening mammogram; this was performed at [facility] on 06/30/2008.  This showed a 1.1 cm mass posterior third right breast.  Additional views were recommended.  Additional views on 07/08/2008 showed a hypoechoic microlobulated mass, maximum diameter parallel to chest wall measuring 1.6 cm.  Biopsy was recommended.  Biopsy on 07/09/2008 showed this to be an ER positive at 6%, PR positive at 44% ductal cancer.  A preoperative MRI scan was performed on 07/23/2008.  This shows a 1.6 x 1.3 x 1.2 cm mass 6 o'clock position right breast.  The patient underwent a lumpectomy and sentinel lymph node evaluation 09/02/2008.  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  This was 0.1 cm from the inferior margin.  Lymphovascular invasion was noted.  Five sentinel lymph nodes were identified all of which were negative for malignancy. Oncotype DX score of 34 predicted a distant recurrence at 23% within 10 years if the patient's only systemic treatment is tamoxifen for 5 years. The patient received adjuvant chemotherapy with docetaxel and cyclophosphamide x4 cycles from 10/17/2008 through 12/18/2008.  Status post radiation therapy from 01/13/2009 through 03/02/2009. The patient took Tamoxifen 03/2009 to [place] 2012.  At that time her antiestrogen therapy was changed to Anastrozole , continued until May 2014, when it was discontinued  due to poor tolerance. The plan is to restart tamoxifen, although the patient had not yet restarted that medication as of December 2014. She is s/p post bilateral salpingo-oophorectomy in July 2012  INTERVAL HISTORY:  [name] returns  for followup. She is doing just fine as she puts it. Family is doing well and she enjoys her work. She exercises by walking and doing the elliptical.--After her last visit here she made a decision not to take any further anti-estrogens. She is updating her health maintenance and is scheduled for a colonoscopy ad lib. hour later this year. She just had a bone density through Dr. Sandrea Hughs office and she wanted me to get her a copy of that to review   REVIEW OF SYSTEMS: She is seeing Gaynelle Arabian for management of left hip bursitis. A detailed review of systems today was otherwise noncontributory   PAST MEDICAL HISTORY: Past Medical History  Diagnosis Date  . BRCA1 negative   . BRCA2 negative   . Vaginal delivery     2 NSVD'S  . Missed ab     ONE  . Osteopenia   . S/P oophorectomy   . Breast cancer 2010    RIGHT BREAST CA/ POSITIVE EST/PROG RECEPTOR    PAST SURGICAL HISTORY: Past Surgical History  Procedure Laterality Date  . Dilation and curettage of uterus      MISSED AB  . Lapr  bso  7.19.12  . Vagina surgery      stretch  . Breast lumpectomy  09/02/2008    Right lumpectomy+sln,PR+,Her2-,T1cN0    FAMILY HISTORY Family History  Problem  Relation Age of Onset  . Thyroid disease Mother   . Cancer Mother 16    br cancer   . Breast cancer Mother   . Cancer Maternal Uncle     PANCREATIC CANCER  . Cancer Paternal Uncle     LUNG- grandmothers brother  . Heart disease Maternal Grandmother   . Cancer Maternal Grandmother     UTERINE  . Heart disease Maternal Grandfather   . Hypertension Maternal Grandfather   . Heart disease Paternal Grandmother   . Heart disease Paternal Grandfather   . Hypertension Paternal Grandfather     GYNECOLOGIC  HISTORY:  (Updated 12/24/2012) G2, P2, menarche age 89, premenopausal with regular periods at time of diagnosis. Status post bilateral salpingo-oophorectomy in July 2012.    GENETICS TESTING: negative for the BRCA1 and 2 gene. [place] results and endometrial biopsy results are mentioned from Jan 2015 negative for malignancy.  SOCIAL HISTORY:  (Updated 12/24/2012) Her husband, Nancy Pollard, is a retired Community education officer. She works at [employer] in [place], and also runs a [employer].  They have lived in [place], [place] for over 10 years.  She has two children, a daughter 71 and a son 76. She originally grew up in [place], [place]. She is a Investment banker, [employer].  ADVANCED DIRECTIVES:  Not on file  HEALTH MAINTENANCE: History  Substance Use Topics  . Smoking status: Never Smoker   . Smokeless tobacco: Never Used  . Alcohol Use: Yes     Comment: RARELY    Colonoscopy: 2017, pending PAP: UTD/Fernandez Bone density: June 2016, osteoporosis, T score -2.8 Lipid panel:  Not on file    No Known Allergies  Current Outpatient Prescriptions  Medication Sig Dispense Refill  . Nutritional Supplements (JUICE PLUS FIBRE) LIQD Take by mouth.     No current facility-administered medications for this visit.    OBJECTIVE: Middle-aged white woman who appears stated age 29 Vitals:   07/24/14 0847  BP: 107/62  Pulse: 80  Temp: 98.1 F (36.7 C)  Resp: 18     Body mass index is 20.96 kg/(m^2).     ECOG FS:  0 Filed Weights   07/24/14 0847  Weight: 137 lb 12.8 oz (62.506 kg)   Sclerae unicteric, pupils round and equal Oropharynx clear and moist-- no thrush or other lesions No cervical or supraclavicular adenopathy Lungs no rales or rhonchi Heart regular rate and rhythm Abd soft, nontender, positive bowel sounds MSK no focal spinal tenderness, no upper extremity lymphedema Neuro: nonfocal, well oriented, appropriate affect Breasts: The right breast is status  post lumpectomy and radiation. There is no evidence of local recurrence. The right axilla is benign per the left breast is unremarkable   LAB RESULTS: Lab Results  Component Value Date   WBC 5.2 07/17/2014   NEUTROABS 2.9 07/17/2014   HGB 12.3 07/17/2014   HCT 37.4 07/17/2014   MCV 87.8 07/17/2014   PLT 208 07/17/2014      Chemistry      Component Value Date/Time   NA 140 07/17/2014 1419   NA 140 03/25/2011 0839   K 3.9 07/17/2014 1419   K 3.9 03/25/2011 0839   CL 105 10/10/2011 0921   CL 104 03/25/2011 0839   CO2 27 07/17/2014 1419   CO2 25 03/25/2011 0839   BUN 16.7 07/17/2014 1419   BUN 13 03/25/2011 0839   CREATININE 1.0 07/17/2014 1419   CREATININE 0.88 03/25/2011 0839      Component Value  Date/Time   CALCIUM 9.4 07/17/2014 1419   CALCIUM 9.2 06/08/2012 1027   ALKPHOS 59 07/17/2014 1419   ALKPHOS 71 03/25/2011 0839   AST 20 07/17/2014 1419   AST 21 03/25/2011 0839   ALT 13 07/17/2014 1419   ALT 12 03/25/2011 0839   BILITOT 0.94 07/17/2014 1419   BILITOT 0.9 03/25/2011 0839       STUDIES:  mammography at [facility] June 2016 was benign.  ASSESSMENT: 49 y.o. BRCA negative [place], [place] woman:  1.  Status post right lumpectomy with sentinel node biopsy on 09/02/2008 for a pT1c pN0, stage IA invasive ductal carcinoma, grade 2, ER 6%, PR 44%, Ki-67 16%, ZHY-8/MVH no implication by CISH with a ratio 0.83  2. Oncotype DX score of 34 predicted a distant recurrence at 23% within 10 years if the patient's only systemic treatment is tamoxifen for 5 years.  3.  The patient received adjuvant chemotherapy with docetaxel and cyclophosphamide x4 cycles from 10/17/2008 through 12/18/2008.    4.  Status post radiation therapy from 01/13/2009 through 03/02/2009.  5.  The patient took Tamoxifen March 2011 to [place] 2012.  At that time her antiestrogen therapy was changed to Anastrozole , continued until May 2014, when it was discontinued due to poor tolerance. Patient restarted  tamoxifen but was having gynecological issues. She had an abnormal ultrasound leading to an endometrial biopsy which was negative for malignancy. She did not resume tamoxifen..  6. status post bilateral salpingo-oophorectomy in July 2012  7: Osteoporosis with a T score of -2.8 on bone density scan at [facility] gynecology 06/24/2014.    PLAN: I discussed with Tyann the data from the last few years which shows not only that tamoxifen for 10 years is superior to tamoxifen for 5 years, but also that aromatase inhibitors for 10 years are superior to 5 years. She had approximately 3 years of antiestrogen son we do know that 5 years is also superior to 3 years.  In short if she went back on tamoxifen (she would not want to go back on aromatase inhibitors because of concerns regarding her bones) for several years she would have an absolute drop in the risk of recurrence in the 3-4% range.  This is not a motivator for her. She prefers to continue to keep her health maintenance up-to-date and "juice".   We then discussed several options regarding follow-up including "graduation,", participation in our new survivorship program, or continuing follow-up as before. After much discussion what she would like to do is see the survivorship nurse practitioner in a year but then see me again about 2 years from now. I think this is very reasonable and I have set that up.  I did give her a copy of her bone density which now shows osteoporosis. She will be discussing that with Dr. Toney Rakes at their upcoming visit.  Chauncey Cruel, MD  Medical Hematologist/Oncologist [place] Pager: [phone] Office No: [phone]     07/24/2014, 9:48 AM

## 2014-08-04 ENCOUNTER — Ambulatory Visit (AMBULATORY_SURGERY_CENTER): Payer: BLUE CROSS/BLUE SHIELD | Admitting: Internal Medicine

## 2014-08-04 ENCOUNTER — Encounter: Payer: Self-pay | Admitting: *Deleted

## 2014-08-04 ENCOUNTER — Encounter: Payer: Self-pay | Admitting: Internal Medicine

## 2014-08-04 VITALS — BP 120/63 | HR 71 | Temp 97.8°F | Resp 9 | Ht 68.0 in | Wt 135.0 lb

## 2014-08-04 DIAGNOSIS — D128 Benign neoplasm of rectum: Secondary | ICD-10-CM | POA: Diagnosis not present

## 2014-08-04 DIAGNOSIS — Z1211 Encounter for screening for malignant neoplasm of colon: Secondary | ICD-10-CM

## 2014-08-04 DIAGNOSIS — D129 Benign neoplasm of anus and anal canal: Secondary | ICD-10-CM

## 2014-08-04 DIAGNOSIS — K621 Rectal polyp: Secondary | ICD-10-CM | POA: Diagnosis not present

## 2014-08-04 MED ORDER — SODIUM CHLORIDE 0.9 % IV SOLN: 500.0000 mL | INTRAVENOUS | Status: DC

## 2014-08-04 NOTE — Op Note (Signed)
[facility]  [address] [place], [address]   COLONOSCOPY PROCEDURE REPORT  PATIENT: Nancy, Pollard  MR#: [id number] BIRTHDATE: Aug 13, 1965 , 44  yrs. old GENDER: female ENDOSCOPIST: Jerene Bears, MD REFERRED DQ:QIWL Toney Rakes, M.D. PROCEDURE DATE:  08/04/2014 PROCEDURE:   Colonoscopy, screening, Colonoscopy with snare polypectomy, and Colonoscopy with cold biopsy polypectomy First Screening Colonoscopy - Avg.  risk and is 50 yrs.  old or older Yes.  Prior Negative Screening - Now for repeat screening. N/A  History of Adenoma - Now for follow-up colonoscopy & has been > or = to 3 yrs.  N/A  Polyps removed today? Yes ASA CLASS:   Class II INDICATIONS:Screening for colonic neoplasia and Colorectal Neoplasm Risk Assessment for this procedure is average risk, 1st colonoscopy. MEDICATIONS: Monitored anesthesia care and Propofol 200 mg IV  DESCRIPTION OF PROCEDURE:   After the risks benefits and alternatives of the procedure were thoroughly explained, informed consent was obtained.  The digital rectal exam revealed no rectal mass.   The pediatric Olympus colonoscope was introduced through the anus and advanced to the cecum, which was identified by both the appendix and ileocecal valve. No adverse events experienced. The quality of the prep was good.  (Suprep was used)  The instrument was then slowly withdrawn as the colon was fully examined. Estimated blood loss is zero unless otherwise noted in this procedure report.   Images not obtained due to software down time.     COLON FINDINGS: Two sessile polyps ranging from 2 to 26mm in size were found in the rectum.  Polypectomies were performed with cold forceps.  The resection was complete, the polyp tissue was completely retrieved and sent to histology.   A sessile polyp measuring 5 mm in size was found in the rectum.  A polypectomy was performed with a cold snare.  The resection was complete, the polyp tissue was  completely retrieved and sent to histology.   The examination was otherwise normal.  Retroflexed views revealed internal hemorrhoids. The time to cecum = 3.1 Withdrawal time = 12.6   The scope was withdrawn and the procedure completed.  COMPLICATIONS: There were no immediate complications.  ENDOSCOPIC IMPRESSION: 1.   Two sessile polyps ranging from 2 to 23mm in size were found in the rectum; polypectomies were performed with cold forceps 2.   Sessile polyp was found in the rectum; polypectomy was performed with a cold snare 3.   The examination was otherwise normal  RECOMMENDATIONS: 1.  Await pathology results 2.  If the polyps removed today are proven to be adenomatous (pre-cancerous) polyps, you will need a repeat colonoscopy in 5 years.  Otherwise you should continue to follow colorectal cancer screening guidelines for "routine risk" patients with colonoscopy in 10 years.  You will receive a letter within 1-2 weeks with the results of your biopsy as well as final recommendations.  Please call my office if you have not received a letter after 3 weeks.  eSigned:  Jerene Bears, MD 08/04/2014 9:48 AM   cc: Uvaldo Rising, MD and The Patient

## 2014-08-04 NOTE — Progress Notes (Signed)
Called to room to assist during endoscopic procedure.  Patient ID and intended procedure confirmed with present staff. Received instructions for my participation in the procedure from the performing physician.  

## 2014-08-04 NOTE — Progress Notes (Signed)
Patient ID: Nancy Pollard, female   DOB: 01-23-1965, 49 y.o.   MRN: [id number] Procedure report mailed to pt/KW

## 2014-08-04 NOTE — Progress Notes (Signed)
Report to PACU, RN, vss, BBS= Clear.  

## 2014-08-05 ENCOUNTER — Telehealth: Payer: Self-pay | Admitting: *Deleted

## 2014-08-05 NOTE — Telephone Encounter (Signed)
Name identifier, left message, follow-up 

## 2014-08-11 ENCOUNTER — Encounter: Payer: Self-pay | Admitting: Internal Medicine

## 2015-06-26 ENCOUNTER — Encounter: Payer: Self-pay | Admitting: Genetic Counselor

## 2015-12-25 ENCOUNTER — Encounter: Payer: Self-pay | Admitting: Gynecology

## 2016-05-25 ENCOUNTER — Encounter: Payer: Self-pay | Admitting: Gynecology

## 2016-06-20 ENCOUNTER — Ambulatory Visit (INDEPENDENT_AMBULATORY_CARE_PROVIDER_SITE_OTHER): Payer: BLUE CROSS/BLUE SHIELD | Admitting: Gynecology

## 2016-06-20 ENCOUNTER — Encounter: Payer: Self-pay | Admitting: Gynecology

## 2016-06-20 VITALS — BP 118/76 | Ht 68.0 in | Wt 152.0 lb

## 2016-06-20 DIAGNOSIS — Z01419 Encounter for gynecological examination (general) (routine) without abnormal findings: Secondary | ICD-10-CM | POA: Diagnosis not present

## 2016-06-20 DIAGNOSIS — Z853 Personal history of malignant neoplasm of breast: Secondary | ICD-10-CM | POA: Diagnosis not present

## 2016-06-20 DIAGNOSIS — M810 Age-related osteoporosis without current pathological fracture: Secondary | ICD-10-CM | POA: Diagnosis not present

## 2016-06-20 DIAGNOSIS — Z780 Asymptomatic menopausal state: Secondary | ICD-10-CM | POA: Diagnosis not present

## 2016-06-20 DIAGNOSIS — W57XXXA Bitten or stung by nonvenomous insect and other nonvenomous arthropods, initial encounter: Secondary | ICD-10-CM | POA: Diagnosis not present

## 2016-06-20 MED ORDER — DOXYCYCLINE HYCLATE 100 MG PO CAPS: ORAL_CAPSULE | ORAL | 0 refills | Status: DC

## 2016-06-20 NOTE — Progress Notes (Signed)
Nancy Pollard [birth year] [id number]   History:    51 y.o.  for annual gyn exam who has not been seen in 2 years was concerned about an indurated area of the medial thigh from a recent tick bite. She denied any fever she had just applied alcohol to the area and removed to take over a week ago.  Patient with past history of right lumpectomy for breast cancer (positive estrogen and progesterone receptors) who had completed treatment with chemotherapy and radiation in the past and originally was started on tamoxifen in February 2011. The patient had been seen in the office in January 2015 he was seen by my nurse practitioner and had ordered an ultrasound because of left lower quadrant discomfort. It was noted at that time that her endometrial stripe was 6.5 mm and she had an endometrial biopsy which was normal. Patient became frightened because of endometrial thickness and she stated that she did not resume her tamoxifen.  Patient has been tested in the past for the BRCA1 and BRCA2 gene mutation and was not present. She had a prophylactic laparoscopic BSO which was benign.Her bone density of 2014 had demonstrated her lowest T score was -2.7 at the AP spine but patient has refused treatment for now. Patient with no previous history of abnormal Pap smear. Patient denies any vaginal bleeding. Patient had a colonoscopy in 2016 benign polyp removed she is on a 5 year recall.  Past medical history,surgical history, family history and social history were all reviewed and documented in the EPIC chart.  Gynecologic History No LMP recorded. Patient is not currently having periods (Reason: Other). Contraception: post menopausal status Last Pap: 2016. Results were: normal Last mammogram: 2017. Results were: normal  Obstetric History OB History  Gravida Para Term Preterm AB Living  _0 SAB TAB Ectopic Multiple Live Births  1       2    # Outcome Date GA Lbr Len/2nd Weight Sex Delivery Anes PTL Lv   3 SAB           2 Term     M Vag-Spont  N LIV  1 Term     F Vag-Spont  N LIV       ROS: A ROS was performed and pertinent positives and negatives are included in the history.  GENERAL: No fevers or chills. HEENT: No change in vision, no earache, sore throat or sinus congestion. NECK: No pain or stiffness. CARDIOVASCULAR: No chest pain or pressure. No palpitations. PULMONARY: No shortness of breath, cough or wheeze. GASTROINTESTINAL: No abdominal pain, nausea, vomiting or diarrhea, melena or bright red blood per rectum. GENITOURINARY: No urinary frequency, urgency, hesitancy or dysuria. MUSCULOSKELETAL: No joint or muscle pain, no back pain, no recent trauma. DERMATOLOGIC:Upper medial thigh 2 cm base erythematous area from previous tick bite. ENDOCRINE: No polyuria, polydipsia, no heat or cold intolerance. No recent change in weight. HEMATOLOGICAL: No anemia or easy bruising or bleeding. NEUROLOGIC: No headache, seizures, numbness, tingling or weakness. PSYCHIATRIC: No depression, no loss of interest in normal activity or change in sleep pattern.     Exam: chaperone present  BP 118/76   Ht _1  (1.727 m)   Wt 152 lb (68.9 kg)   BMI 23.11 kg/m   Body mass index is 23.11 kg/m.  General appearance : Well developed well nourished female. No acute distress HEENT: Eyes: no retinal hemorrhage or exudates,  Neck supple, trachea midline, no carotid  bruits, no thyroidmegaly Lungs: Clear to auscultation, no rhonchi or wheezes, or rib retractions  Heart: Regular rate and rhythm, no murmurs or gallops Breast:Examined in sitting and supine position were symmetrical in appearance, no palpable masses or tenderness,  no skin retraction, no nipple inversion, no nipple discharge, no skin discoloration, no axillary or supraclavicular lymphadenopathy Abdomen: no palpable masses or tenderness, no rebound or guarding Extremities: no edema or skin discoloration or tenderness  Pelvic:  Bartholin, Urethra,  Skene Glands: Within normal limits             Vagina: No gross lesions or discharge  Cervix: No gross lesions or discharge  Uterus  anteverted, normal size, shape and consistency, non-tender and mobile  Adnexa  Without masses or tenderness  Anus and perineum  normal   Rectovaginal  normal sphincter tone without palpated masses or tenderness             Hemoccult cards will be provided     Assessment/Plan:  51 y.o. female for annual exam with tick bite or week ago from medial left thigh erythema slightly indurated area she'll be placed on dicloxacillin 100 mg twice a day for 10 days. She was instructed to use antibacterial soap and apply Neosporin twice a day to the area. If no improvement in 2 weeks she'll return back to the office for possible I&D. She'll return back next week for fasting blood work consisting of comprehensive metabolic panel, fasting lipid profile, and CBC. Pap smear not indicated this year. She was provided with fecal Hemoccult cards to submit to the office for testing. Patient to schedule her bone density study. Patient with history of osteoporosis refused treatment in 2016.  Additional 10 minutes was spent discussing osteoporosis as well as evaluating from previous tick bite and treatment.   Terrance Mass MD, 2:55 PM 06/20/2016

## 2016-06-20 NOTE — Patient Instructions (Addendum)
Bone Densitometry Bone densitometry is an imaging test that uses a special X-ray to measure the amount of calcium and other minerals in your bones (bone density). This test is also known as a bone mineral density test or dual-energy X-ray absorptiometry (DXA). The test can measure bone density at your hip and your spine. It is similar to having a regular X-ray. You may have this test to:  Diagnose a condition that causes weak or thin bones (osteoporosis).  Predict your risk of a broken bone (fracture).  Determine how well osteoporosis treatment is working.  Tell a health care provider about:  Any allergies you have.  All medicines you are taking, including vitamins, herbs, eye drops, creams, and over-the-counter medicines.  Any problems you or family members have had with anesthetic medicines.  Any blood disorders you have.  Any surgeries you have had.  Any medical conditions you have.  Possibility of pregnancy.  Any other medical test you had within the previous 14 days that used contrast material. What are the risks? Generally, this is a safe procedure. However, problems can occur and may include the following:  This test exposes you to a very small amount of radiation.  The risks of radiation exposure may be greater to unborn children.  What happens before the procedure?  Do not take any calcium supplements for 24 hours before having the test. You can otherwise eat and drink what you usually do.  Take off all metal jewelry, eyeglasses, dental appliances, and any other metal objects. What happens during the procedure?  You may lie on an exam table. There will be an X-ray generator below you and an imaging device above you.  Other devices, such as boxes or braces, may be used to position your body properly for the scan.  You will need to lie still while the machine slowly scans your body.  The images will show up on a computer monitor. What happens after the  procedure? You may need more testing at a later time. This information is not intended to replace advice given to you by your health care provider. Make sure you discuss any questions you have with your health care provider. Document Released: 01/19/2004 Document Revised: 06/04/2015 Document Reviewed: 06/06/2013 Elsevier Interactive Patient Education  2018 [place], Adult Ticks are insects that draw blood for food. Most ticks live in shrubs and grassy areas. They climb onto people and animals that brush against the leaves and grasses that they rest on. Then they bite, attaching themselves to the skin. Most ticks are harmless, but some ticks carry germs that can spread to a person through a bite and cause a disease. To reduce your risk of getting a disease from a tick bite, it is important to take steps to prevent tick bites. It is also important to check for ticks after being outdoors. If you find that a tick has attached to you, watch for symptoms of disease. How can I prevent tick bites? Take these steps to help prevent tick bites when you are outdoors in an area where ticks are found:  Use insect repellent that has DEET (20% or higher), picaridin, or IR3535 in it. Use it on: ? Skin that is showing. ? The top of your boots. ? Your pant legs. ? Your sleeve cuffs.  For repellent products that contain permethrin, follow product instructions. Use these products on: ? Clothing. ? Gear. ? Boots. ? Tents.  Wear protective clothing. Long sleeves and long  pants offer the best protection from ticks.  Wear light-colored clothing so you can see ticks more easily.  Tuck your pant legs into your socks.  If you go walking on a trail, stay in the middle of the trail so your skin, hair, and clothing do not touch the bushes.  Avoid walking through areas with long grass.  Check for ticks on your clothing, hair, and skin often while you are outside, and check again before  you go inside. Make sure to check the places that ticks attach themselves most often. These places include the scalp, neck, armpits, waist, groin, and joint areas. Ticks that carry a disease called Lyme disease have to be attached to the skin for 24-48 hours. Checking for ticks every day will lessen your risk of this and other diseases.  When you come indoors, wash your clothes and take a shower or a bath right away. Dry your clothes in a dryer on high heat for at least 60 minutes. This will kill any ticks in your clothes.  What is the proper way to remove a tick? If you find a tick on your body, remove it as soon as possible. Removing a tick sooner rather than later can prevent germs from passing from the tick to your body. To remove a tick that is crawling on your skin but has not bitten:  Go outdoors and brush the tick off.  Remove the tick with tape or a lint roller.  To remove a tick that is attached to your skin:  Wash your hands.  If you have latex gloves, put them on.  Use tweezers, curved forceps, or a tick-removal tool to gently grasp the tick as close to your skin and the tick's head as possible.  Gently pull with steady, upward pressure until the tick lets go. When removing the tick: ? Take care to keep the tick's head attached to its body. ? Do not twist or jerk the tick. This can make the tick's head or mouth break off. ? Do not squeeze or crush the tick's body. This could force disease-carrying fluids from the tick into your body.  Do not try to remove a tick with heat, alcohol, petroleum jelly, or fingernail polish. Using these methods can cause the tick to salivate and regurgitate into your bloodstream, increasing your risk of getting a disease. What should I do after removing a tick?  Clean the bite area with soap and water, rubbing alcohol, or an iodine scrub.  If an antiseptic cream or ointment is available, apply a small amount to the bite site.  Wash and disinfect  any instruments that you used to remove the tick. How should I dispose of a tick? To dispose of a live tick, use one of these methods:  Place it in rubbing alcohol.  Place it in a sealed bag or container.  Wrap it tightly in tape.  Flush it down the toilet.  Contact a health care provider if:  You have symptoms of a disease after a tick bite. Symptoms of a tick-borne disease can occur from moments after the tick bites to up to 30 days after a tick is removed. Symptoms include: ? Muscle, joint, or bone pain. ? Difficulty walking or moving your legs. ? Numbness in the legs. ? Paralysis. ? Red rash around the tick bite area that is shaped like a target or a "bull's-eye." ? Redness and swelling in the area of the tick bite. ? Fever. ? Repeated vomiting. ?  Diarrhea. ? Weight loss. ? Tender, swollen lymph glands. ? Shortness of breath. ? Cough. ? Pain in the abdomen. ? Headache. ? Abnormal tiredness. ? A change in your level of consciousness. ? Confusion. Get help right away if:  You are not able to remove a tick.  A part of a tick breaks off and gets stuck in your skin.  Your symptoms get worse. Summary  Ticks may carry germs that can spread to a person through a bite and cause disease.  Wear protective clothing and use insect repellent to prevent tick bites. Follow product instructions.  If you find a tick on your body, remove it as soon as possible. If the tick is attached, do not try to remove with heat, alcohol, petroleum jelly, or fingernail polish.  Remove the attached tick using tweezers, curved forceps, or a tick-removal tool. Gently pull with steady, upward pressure until the tick lets go. Do not twist or jerk the tick. Do not squeeze or crush the tick's body.  If you have symptoms after being bitten by a tick, contact a health care provider. This information is not intended to replace advice given to you by your health care provider. Make sure you discuss any  questions you have with your health care provider. Document Released: 12/25/1999 Document Revised: 10/09/2015 Document Reviewed: 10/09/2015 Elsevier Interactive Patient Education  [employer].

## 2016-07-12 ENCOUNTER — Telehealth: Payer: Self-pay | Admitting: *Deleted

## 2016-07-12 ENCOUNTER — Other Ambulatory Visit: Payer: BLUE CROSS/BLUE SHIELD

## 2016-07-12 ENCOUNTER — Ambulatory Visit (INDEPENDENT_AMBULATORY_CARE_PROVIDER_SITE_OTHER): Payer: BLUE CROSS/BLUE SHIELD

## 2016-07-12 DIAGNOSIS — M810 Age-related osteoporosis without current pathological fracture: Secondary | ICD-10-CM | POA: Diagnosis not present

## 2016-07-12 DIAGNOSIS — Z853 Personal history of malignant neoplasm of breast: Secondary | ICD-10-CM | POA: Diagnosis not present

## 2016-07-12 LAB — CBC WITH DIFFERENTIAL/PLATELET
BASOS PCT: 1 %
Basophils Absolute: 41 cells/uL (ref 0–200)
EOS ABS: 123 {cells}/uL (ref 15–500)
Eosinophils Relative: 3 %
HEMATOCRIT: 39.4 % (ref 35.0–45.0)
Hemoglobin: 12.8 g/dL (ref 11.7–15.5)
LYMPHS PCT: 29 %
Lymphs Abs: 1189 cells/uL (ref 850–3900)
MCH: 28.5 pg (ref 27.0–33.0)
MCHC: 32.5 g/dL (ref 32.0–36.0)
MCV: 87.8 fL (ref 80.0–100.0)
MONO ABS: 328 {cells}/uL (ref 200–950)
MPV: 8.6 fL (ref 7.5–12.5)
Monocytes Relative: 8 %
Neutro Abs: 2419 cells/uL (ref 1500–7800)
Neutrophils Relative %: 59 %
Platelets: 285 10*3/uL (ref 140–400)
RBC: 4.49 MIL/uL (ref 3.80–5.10)
RDW: 13.2 % (ref 11.0–15.0)
WBC: 4.1 10*3/uL (ref 3.8–10.8)

## 2016-07-12 LAB — LIPID PANEL
Cholesterol: 150 mg/dL (ref <200)
HDL: 65 mg/dL (ref >50)
LDL Cholesterol: 74 mg/dL (ref <100)
TRIGLYCERIDES: 55 mg/dL (ref <150)
Total CHOL/HDL Ratio: 2.3 Ratio (ref <5.0)
VLDL: 11 mg/dL (ref <30)

## 2016-07-12 LAB — COMPREHENSIVE METABOLIC PANEL
ALK PHOS: 62 U/L (ref 33–130)
ALT: 12 U/L (ref 6–29)
AST: 19 U/L (ref 10–35)
Albumin: 4.2 g/dL (ref 3.6–5.1)
BUN: 15 mg/dL (ref 7–25)
CALCIUM: 9 mg/dL (ref 8.6–10.4)
CHLORIDE: 103 mmol/L (ref 98–110)
CO2: 24 mmol/L (ref 20–31)
Creat: 0.88 mg/dL (ref 0.50–1.05)
Glucose, Bld: 81 mg/dL (ref 65–99)
Potassium: 4 mmol/L (ref 3.5–5.3)
Sodium: 140 mmol/L (ref 135–146)
TOTAL PROTEIN: 6.7 g/dL (ref 6.1–8.1)
Total Bilirubin: 0.9 mg/dL (ref 0.2–1.2)

## 2016-07-12 NOTE — Telephone Encounter (Signed)
Pt called requesting name of PCP office, I called pt back and gave her name of [facility].

## 2016-07-13 LAB — VITAMIN D 25 HYDROXY (VIT D DEFICIENCY, FRACTURES): Vit D, 25-Hydroxy: 31 ng/mL (ref 30–100)

## 2016-07-19 ENCOUNTER — Other Ambulatory Visit: Payer: Self-pay | Admitting: *Deleted

## 2016-07-19 DIAGNOSIS — M818 Other osteoporosis without current pathological fracture: Secondary | ICD-10-CM

## 2016-07-28 ENCOUNTER — Other Ambulatory Visit: Payer: BLUE CROSS/BLUE SHIELD

## 2016-07-28 DIAGNOSIS — M818 Other osteoporosis without current pathological fracture: Secondary | ICD-10-CM

## 2016-07-29 LAB — VITAMIN D 25 HYDROXY (VIT D DEFICIENCY, FRACTURES): VIT D 25 HYDROXY: 37 ng/mL (ref 30–100)

## 2016-07-29 LAB — PTH, INTACT AND CALCIUM
CALCIUM: 9.3 mg/dL (ref 8.6–10.4)
PTH: 33 pg/mL (ref 14–64)

## 2016-08-09 ENCOUNTER — Ambulatory Visit (INDEPENDENT_AMBULATORY_CARE_PROVIDER_SITE_OTHER): Payer: BLUE CROSS/BLUE SHIELD | Admitting: Obstetrics & Gynecology

## 2016-08-09 ENCOUNTER — Encounter: Payer: Self-pay | Admitting: Obstetrics & Gynecology

## 2016-08-09 VITALS — BP 110/70

## 2016-08-09 DIAGNOSIS — Z170 Estrogen receptor positive status [ER+]: Secondary | ICD-10-CM | POA: Diagnosis not present

## 2016-08-09 DIAGNOSIS — M810 Age-related osteoporosis without current pathological fracture: Secondary | ICD-10-CM

## 2016-08-09 DIAGNOSIS — C50411 Malignant neoplasm of upper-outer quadrant of right female breast: Secondary | ICD-10-CM

## 2016-08-09 NOTE — Patient Instructions (Signed)
1. Age-related osteoporosis without current pathological fracture Low Estrogen state x 2011 d/t Breast Ca treatment, including BSO 2012.  Worsening Osteoporosis at Spine.  Osteopenia both Hips with significant loss x 2016.  Treatment options discussed, in particular, recommend Prolia.  Usage/risks/benefits reviewed and information pamphlet given.  On Vit D supplement, Ca++ rich nutrition, Wt bearing physical activity.  Patient will read pamphlet, inform herself further and decide if wants to start on Prolia.  Will also verify Insurance coverage/deductible.  2. Malignant neoplasm of upper-outer quadrant of right breast in female, estrogen receptor positive ([place]) Dx 2011.  Jene, it was a pleasure to meet you today.  I sent a message to [facility] who will contact you soon.   Osteoporosis Osteoporosis is the thinning and loss of density in the bones. Osteoporosis makes the bones more brittle, fragile, and likely to break (fracture). Over time, osteoporosis can cause the bones to become so weak that they fracture after a simple fall. The bones most likely to fracture are the bones in the hip, wrist, and spine. What are the causes? The exact cause is not known. What increases the risk? Anyone can develop osteoporosis. You may be at greater risk if you have a family history of the condition or have poor nutrition. You may also have a higher risk if you are:  Female.  32 years old or older.  A smoker.  Not physically active.  White or Asian.  Slender.  What are the signs or symptoms? A fracture might be the first sign of the disease, especially if it results from a fall or injury that would not usually cause a bone to break. Other signs and symptoms include:  Low back and neck pain.  Stooped posture.  Height loss.  How is this diagnosed? To make a diagnosis, your health care provider may:  Take a medical history.  Perform a physical exam.  Order tests, such as: ? A bone mineral density  test. ? A dual-energy X-ray absorptiometry test.  How is this treated? The goal of osteoporosis treatment is to strengthen your bones to reduce your risk of a fracture. Treatment may involve:  Making lifestyle changes, such as: ? Eating a diet rich in calcium. ? Doing weight-bearing and muscle-strengthening exercises. ? Stopping tobacco use. ? Limiting alcohol intake.  Taking medicine to slow the process of bone loss or to increase bone density.  Monitoring your levels of calcium and vitamin D.  Follow these instructions at home:  Include calcium and vitamin D in your diet. Calcium is important for bone health, and vitamin D helps the body absorb calcium.  Perform weight-bearing and muscle-strengthening exercises as directed by your health care provider.  Do not use any tobacco products, including cigarettes, chewing tobacco, and electronic cigarettes. If you need help quitting, ask your health care provider.  Limit your alcohol intake.  Take medicines only as directed by your health care provider.  Keep all follow-up visits as directed by your health care provider. This is important.  Take precautions at home to lower your risk of falling, such as: ? Keeping rooms well lit and clutter free. ? Installing safety rails on stairs. ? Using rubber mats in the bathroom and other areas that are often wet or slippery. Get help right away if: You fall or injure yourself. This information is not intended to replace advice given to you by your health care provider. Make sure you discuss any questions you have with your health care provider. Document Released:  10/06/2004 Document Revised: 06/01/2015 Document Reviewed: 06/06/2013 Elsevier Interactive Patient Education  2017 [employer].

## 2016-08-09 NOTE — Progress Notes (Signed)
    CHINWE LOPE [birth year] [id number]        51 y.o.  [id number]  S/P BSO  RP:  Discuss/manage Bone Density results  HPI:  H/O Rt Breast Ca post RadioRx/ChemoRx (E+P receptors pos) 2011.  BDs 2016 Spine T-Score at -2.8.  On Vit D supplement.  Goes to [employer] for [employer] bearing physical activity.    Past medical history,surgical history, problem list, medications, allergies, family history and social history were all reviewed and documented in the EPIC chart.  Directed ROS with pertinent positives and negatives documented in the history of present illness/assessment and plan.  Exam:  Vitals:   08/09/16 1508  BP: 110/70   General appearance:  Normal  Bone Density 07/12/16:  AP Spine T-score -3.1 (T-score -2.8 in 2016)  Assessment/Plan:  51 y.o. G3P2012   1. Age-related osteoporosis without current pathological fracture Low Estrogen state x 2011 d/t Breast Ca treatment, including BSO 2012.  Worsening Osteoporosis at Spine.  Osteopenia both Hips with significant loss x 2016.  Treatment options discussed, in particular, recommend Prolia.  Usage/risks/benefits reviewed and information pamphlet given.  On Vit D supplement, Ca++ rich nutrition, Wt bearing physical activity.  Patient will read pamphlet, inform herself further and decide if wants to start on Prolia.  Will also verify Insurance coverage/deductible.  2. Malignant neoplasm of upper-outer quadrant of right breast in female, estrogen receptor positive ([place]) Dx 2011.  Counseling on above issues >50% x 25 minutes. Princess Bruins MD, 3:18 PM 08/09/2016

## 2016-08-10 ENCOUNTER — Telehealth: Payer: Self-pay | Admitting: Gynecology

## 2016-08-10 NOTE — Telephone Encounter (Signed)
Osteoporosis at Spine. H/O Breast Ca. Recommend Prolia. Info/pamphlet given. Patient reading info. Would like to make sure it is covered. Has a high deductible. How much will she have to pay... Thanks  Note from Dr Dellis Filbert

## 2016-10-11 NOTE — Telephone Encounter (Signed)
PC to pt left message, Calcium 9.0 07/12/16, Complete ML 08/09/16  Deductible $2700 ($597 met) balance $2103.  BSBC allowable [id number] $1079.40  [id number]  $51.42  Total =1130.42  [place]  approx $1130.42 , she may apply for Prolia benefit card $1500. I left her a message to call me.  

## 2016-10-19 ENCOUNTER — Encounter: Payer: Self-pay | Admitting: Anesthesiology

## 2016-10-20 NOTE — Telephone Encounter (Signed)
LM for pt to call me regarding ProLia we have benefits, Prolia card also needed, I asked her again to call me with her decision about Prolia.

## 2016-10-25 NOTE — Telephone Encounter (Signed)
VM to pt requesting her decision on Prolia. I have left instructions to call if she is not interested in Prolia.

## 2016-11-01 NOTE — Telephone Encounter (Signed)
VM from pt stating that she is not interested in taking the Prolia. I will forward this to Dr Dellis Filbert.

## 2016-11-05 NOTE — Telephone Encounter (Signed)
Offer Fosamax treatment.  If declines, repeat BD in 2 years.

## 2016-11-07 ENCOUNTER — Telehealth: Payer: Self-pay | Admitting: Gynecology

## 2016-11-07 NOTE — Telephone Encounter (Signed)
Offer Fosamax treatment.  If declines, repeat BD in 2 years. Note from Dr Dellis Filbert. I will forward this to [facility] . Pt declined  Prolia injection

## 2016-11-09 NOTE — Telephone Encounter (Signed)
Left message for pt to call regarding this. 

## 2016-11-14 NOTE — Telephone Encounter (Signed)
Pt declined Rx for now, will try natural supplements.

## 2019-08-21 ENCOUNTER — Encounter: Payer: Self-pay | Admitting: Genetic Counselor
